# Patient Record
Sex: Female | Born: 1955 | Race: White | Marital: Married | Smoking: Former smoker
Health system: Northeastern US, Academic
[De-identification: ages and names within clinical notes are randomized; demographics above are authoritative.]

## PROBLEM LIST (undated history)

## (undated) HISTORY — PX: HYSTERECTOMY: SHX81

## (undated) HISTORY — PX: KNEE SURGERY: SHX244

## (undated) HISTORY — PX: TUBAL LIGATION: SHX77

---

## 2006-10-19 LAB — HM COLONOSCOPY

## 2006-10-30 DIAGNOSIS — R030 Elevated blood-pressure reading, without diagnosis of hypertension: Secondary | ICD-10-CM | POA: Insufficient documentation

## 2008-01-15 DIAGNOSIS — J45909 Unspecified asthma, uncomplicated: Secondary | ICD-10-CM | POA: Insufficient documentation

## 2009-11-03 ENCOUNTER — Ambulatory Visit: Payer: Self-pay | Admitting: Primary Care

## 2009-11-03 ENCOUNTER — Encounter: Payer: Self-pay | Admitting: Gastroenterology

## 2009-11-03 LAB — URINALYSIS WITH REFLEX TO MICROSCOPIC
Blood,UA: NEGATIVE
Ketones, UA: NEGATIVE
Leuk Esterase,UA: NEGATIVE
Nitrite,UA: NEGATIVE
Protein,UA: 10 mg/dL — AB
Specific Gravity,UA: 1.017 (ref 1.002–1.030)
pH,UA: 6 (ref 5.0–8.0)

## 2009-11-03 LAB — CBC AND DIFFERENTIAL
Baso # K/uL: 0 THOU/uL (ref 0.0–0.1)
Basophil %: 0.4 % (ref 0.1–1.2)
Eos # K/uL: 0 THOU/uL (ref 0.0–0.4)
Eosinophil %: 0 % — ABNORMAL LOW (ref 0.7–5.8)
Hematocrit: 41 % (ref 34–45)
Hemoglobin: 13.6 g/dL (ref 11.2–15.7)
Lymph # K/uL: 0.8 THOU/uL — ABNORMAL LOW (ref 1.2–3.7)
Lymphocyte %: 30.2 % (ref 19.3–51.7)
MCV: 92 fL (ref 79–95)
Mono # K/uL: 0.3 THOU/uL (ref 0.2–0.9)
Monocyte %: 10.6 % (ref 4.7–12.5)
Neut # K/uL: 1.6 THOU/uL (ref 1.6–6.1)
Platelets: 159 THOU/uL — ABNORMAL LOW (ref 160–370)
RBC: 4.5 MIL/uL (ref 3.9–5.2)
RDW: 12.4 % (ref 11.7–14.4)
Seg Neut %: 58.8 % (ref 34.0–71.1)
WBC: 2.7 THOU/uL — ABNORMAL LOW (ref 4.0–10.0)

## 2009-11-03 LAB — COMPREHENSIVE METABOLIC PANEL
ALT: 16 U/L (ref 0–35)
AST: 24 U/L (ref 0–35)
Albumin: 4.6 g/dL (ref 3.5–5.2)
Alk Phos: 59 U/L (ref 35–105)
Anion Gap: 10 (ref 7–16)
Bilirubin,Total: 0.5 mg/dL (ref 0.0–1.2)
CO2: 29 mmol/L — ABNORMAL HIGH (ref 20–28)
Calcium: 9.2 mg/dL (ref 8.6–10.2)
Chloride: 99 mmol/L (ref 96–108)
Creatinine: 0.88 mg/dL (ref 0.51–0.95)
GFR,Black: >59 *
GFR,Caucasian: >59 *
Glucose: 105 mg/dL (ref 74–106)
Lab: 14 mg/dL (ref 6–20)
Potassium: 4 mmol/L (ref 3.3–5.1)
Sodium: 138 mmol/L (ref 133–145)
Total Protein: 7.5 g/dL (ref 6.3–7.7)

## 2009-11-03 LAB — URINE MICROSCOPIC (IQ200)
RBC,UA: <1 /HPF (ref 0–2)
WBC,UA: <1 /HPF (ref 0–5)

## 2009-11-04 LAB — AEROBIC CULTURE

## 2009-11-05 ENCOUNTER — Ambulatory Visit: Payer: Self-pay | Admitting: Primary Care

## 2009-11-05 ENCOUNTER — Other Ambulatory Visit: Payer: Self-pay | Admitting: Primary Care

## 2009-11-05 LAB — CBC
Hematocrit: 39 % (ref 34–45)
Hemoglobin: 13.1 g/dL (ref 11.2–15.7)
MCV: 91 fL (ref 79–95)
Platelets: 143 THOU/uL — ABNORMAL LOW (ref 160–370)
RBC: 4.3 MIL/uL (ref 3.9–5.2)
RDW: 12.4 % (ref 11.7–14.4)
WBC: 2.4 THOU/uL — ABNORMAL LOW (ref 4.0–10.0)

## 2009-11-05 NOTE — Progress Notes (Signed)
Reason For Visit   Persistent fever, low white blood cell count.  HPI   Patient presents again after calling the office with recurrent fever.    Although she felt a little bit better she notes that she continues to spike   fevers.  She denies any shortness of breath or wheezing.  She continues to   complain of a dry hacking cough and pain behind her eyes towards the end of   the day.  She has no GI symptoms including nausea, vomiting or diarrhea.    She does wonder if she may have an early sinus infection.  She is eating   and drinking without difficulty.  Allergies   Latex Exam Gloves MISC; Rash  Sulfa Drugs; Hives.  Current Meds   Fluticasone Propionate 50 MCG/ACT Suspension;one squirt in each nostril   nightly; Rx  Proventil HFA 108 (90 Base) MCG/ACT Aerosol Solution;INHALE 1-2 PUFFS EVERY   4-6 HOURS AS NEEDED AND AS DIRECTED.; Rx  Penicillin V Potassium 500 MG Tablet;TAKE 1 CAPSULE TWICE DAILY; Rx  Estrace 0.1 MG/GM Cream;; RPT  Azithromycin 250 MG Tablet;TAKE 2 TABLETS ON DAY 1 THEN TAKE 1 TABLET A DAY   FOR 4 DAYS.; Rx.  Active Problems   Asthmatic Bronchitis (493.90)  Complete Colonoscopy  Prehypertension (796.2).  Vital Signs   Recorded by [facility] on 05 Nov 2009 11:59 AM  BP:112/80,  RUE,  Sitting,   HR: 104 b/min,  R Radial, Normal,   Temp: 98.6 F,  Oral,   Weight: 158.6 lb.  Physical Exam   53 year old in no acute distress, nontoxic appearing  Vital signs as recorded including oximetry of 95% on room air  HEENT: TMs pearly gray, oropharynx mildly injected without exudate, neck   supple  Lungs are clear to auscultation without rales or wheezes  Cardiac rate and rhythm are slightly tachycardic but regular  Extremities are without edema.  Results   Rapid strep test negative  .  Assessment   Assessment and plan:  53 year old with persistent fevers and dry hacking cough with pain behind   the eyes.  Her throat culture is negative.  She may have an early sinusitis   but I favor rechecking CBC today as  well as a chest x-ray.  Pending the   results of these I may end up treating her with antibiotics.     Addendum: [facility] called with chest x-ray results with questionable early left   lower lobe pneumonia.  I have started patient on Zithromax.  She is to call   with persistent or worsening symptoms.  Signature   Electronically signed by: Rosalene Billings  M.D.; 11/05/2009 3:48 PM EST.

## 2009-11-07 LAB — STREP A CULTURE, THROAT

## 2009-11-07 NOTE — Progress Notes (Signed)
Reason For Visit   Cough, fever, and pain behind eyes.  HPI   Pt noted last week Tues/WEd that she had chills, very occasional cough.  By   Friday- felt cold and then saturday took temp- 99.5 and then up to 100.5.    Has used theraflu and went to bed- remained cold and cough has progressed.    Still dry cough.  Pain behind both eyes like an ache after a full day-   better in the am.  Drinking fluids well, no sore throat, vomiting, or   diarrhea.  SOB with stairs, no wheezing.  No chest pain. ? chest   congestion.  No ill contacts.  Allergies   Latex Exam Gloves MISC; Rash  Sulfa Drugs; Hives.  Current Meds   Fluticasone Propionate 50 MCG/ACT Suspension;one squirt in each nostril   nightly; Rx  Proventil HFA 108 (90 Base) MCG/ACT Aerosol Solution;INHALE 1-2 PUFFS EVERY   4-6 HOURS AS NEEDED AND AS DIRECTED.; Rx  Penicillin V Potassium 500 MG Tablet;TAKE 1 CAPSULE TWICE DAILY; Rx.  Active Problems   Asthmatic Bronchitis (493.90)  Complete Colonoscopy  Prehypertension (796.2).  Vital Signs   Recorded by Greig Right on 03 Nov 2009 10:44 AM  BP:116/78,  RUE,  Sitting,   HR: 116 b/min,  R Radial,   Temp: 100.4 F,  Oral,   Weight: 159.8 lb,   O2 Sat: 96.0 (%SpO2),  RA.  Physical Exam   Fatigued-appearing 53 year old nontoxic and coughing  Vital signs are as recorded  HEENT: TMs are pearly gray, oropharynx appears slightly injected and dry   without exudate, neck is supple without adenopathy  Lungs are clear to auscultation throughout without rales, wheezes, or   rhonchi  Cardiac rate and rhythm are regular on recheck with a normal S1 and S2  .  Results   EKG reveals normal sinus rhythm at a rate of 95.  Assessment   Assessment and plan:  54 year old with what I suspect is a viral syndrome.  She may actually have   even had influenza.  She did get the flu shot this year.  She is far out of   the window for antiviral therapy currently.  I will check a CBC and   comprehensive metabolic profile.  5 advised patient to use  either Tylenol   or ibuprofen for her fever and to push fluids.  She is to call if her   symptoms persist or worsen.  Signature   Electronically signed by: Rosalene Billings  M.D.; 11/07/2009 12:33 PM EST.

## 2009-11-23 ENCOUNTER — Ambulatory Visit
Admit: 2009-11-23 | Discharge: 2009-11-23 | Disposition: A | Discharge status: Home / Self Care | Payer: Self-pay | Source: Ambulatory Visit | Attending: Primary Care | Admitting: Primary Care

## 2009-11-23 LAB — CBC AND DIFFERENTIAL
Baso # K/uL: 0 THOU/uL (ref 0.0–0.1)
Basophil %: 0.6 % (ref 0.1–1.2)
Eos # K/uL: 0 THOU/uL (ref 0.0–0.4)
Eosinophil %: 1.2 % (ref 0.7–5.8)
Hematocrit: 39 % (ref 34–45)
Hemoglobin: 12.7 g/dL (ref 11.2–15.7)
Lymph # K/uL: 1.8 THOU/uL (ref 1.2–3.7)
Lymphocyte %: 51.6 % (ref 19.3–51.7)
MCV: 93 fL (ref 79–95)
Mono # K/uL: 0.3 THOU/uL (ref 0.2–0.9)
Monocyte %: 7.6 % (ref 4.7–12.5)
Neut # K/uL: 1.3 THOU/uL — ABNORMAL LOW (ref 1.6–6.1)
Platelets: 220 THOU/uL (ref 160–370)
RBC: 4.2 MIL/uL (ref 3.9–5.2)
RDW: 12.6 % (ref 11.7–14.4)
Seg Neut %: 39 % (ref 34.0–71.1)
WBC: 3.4 THOU/uL — ABNORMAL LOW (ref 4.0–10.0)

## 2010-03-15 ENCOUNTER — Ambulatory Visit
Admit: 2010-03-15 | Discharge: 2010-03-15 | Disposition: A | Discharge status: Home / Self Care | Payer: Self-pay | Source: Ambulatory Visit | Attending: Primary Care | Admitting: Primary Care

## 2010-03-15 LAB — CBC AND DIFFERENTIAL
Baso # K/uL: 0 THOU/uL (ref 0.0–0.1)
Basophil %: 0.6 % (ref 0.1–1.2)
Eos # K/uL: 0 THOU/uL (ref 0.0–0.4)
Eosinophil %: 1.3 % (ref 0.7–5.8)
Hematocrit: 38 % (ref 34–45)
Hemoglobin: 12.6 g/dL (ref 11.2–15.7)
Lymph # K/uL: 1.3 THOU/uL (ref 1.2–3.7)
Lymphocyte %: 41.9 % (ref 19.3–51.7)
MCV: 91 fL (ref 79–95)
Mono # K/uL: 0.2 THOU/uL (ref 0.2–0.9)
Monocyte %: 7.4 % (ref 4.7–12.5)
Neut # K/uL: 1.5 THOU/uL — ABNORMAL LOW (ref 1.6–6.1)
Platelets: 187 THOU/uL (ref 160–370)
RBC: 4.2 MIL/uL (ref 3.9–5.2)
RDW: 13.2 % (ref 11.7–14.4)
Seg Neut %: 48.8 % (ref 34.0–71.1)
WBC: 3.1 THOU/uL — ABNORMAL LOW (ref 4.0–10.0)

## 2010-03-22 ENCOUNTER — Encounter: Payer: Self-pay | Admitting: Primary Care

## 2010-03-26 ENCOUNTER — Ambulatory Visit: Payer: Self-pay | Admitting: Primary Care

## 2010-03-26 ENCOUNTER — Ambulatory Visit: Payer: Self-pay | Admitting: Cardiology

## 2010-03-31 NOTE — H&P (Signed)
Reason For Visit   CPE.  HPI   1.  long standing RLE edema- this occurred after her c sections and is   steadily worse over the years, not painful but she feels it is   uncomfortable and does not understand why it has occurred.  2.  mildly low wbc count with normal hct and plt counts which we agreed to   follow for now.  3.  recovered from recent viral infection in December- she was concerned   that this lasted longer than she might normally be sick and was concerned   that it related to her low wbc- however neutrophil count was good.  4.  Pt felt a twitching in her left rib/ breast for 3-5 seconds which was   followed by a PVC.  She has had this in the past and saw Dr. Hal Hope and   she had a 24 holter monitor and it resolved.  This was 28 yrs ago but   recently had one recurrence.  no SOB, no pain.  no radiation. No dizziness,   diaphoresis, SOB.  5.  WE discussed her h/o divorce in great detail today and her relationship   or lack of one with her birth mother.  She may seek further counselling at   some point.  Denies depressive sx accompanying this currently.  Allergies   Latex Exam Gloves MISC; Rash  Sulfa Drugs; Hives.  Current Meds   Estrace 0.1 MG/GM Cream;; RPT  Viactiv CHEW;CHEW ONE TABLET BY MOUTH THREE TIMES DAILY; RPT  Vitamin D 1000 UNIT Capsule;TAKE 2 CAPSULE DAILY; RPT  Flax Seed Oil 1000 MG Capsule;TAKE AS DIRECTED.; RPT.  ** Medication reconciliation completed. **  .  Active Problems   Asthmatic Bronchitis (493.90)  Complete Colonoscopy  Prehypertension (796.2).  PMH   Prehypertension (796.2).  PSH   Cesarean Hysterectomy  Complete Colonoscopy  Knee Surgery  Tubal Ligation (V25.2).  Family Hx   Family history of Adopted  Family history of Breast Cancer  Family history of Hypertension  Family history of Stroke Syndrome.  Personal Hx   Divorced, tempered relationship with her ex  Four children via c section  wears seat belt, gets routine dental and eye care  does not exercise regularly, watches  diet  does clerical work at [employer]  quit smoking fifteen years ago, approx one drink/ month.  ROS   CONSTITUTIONAL: Appetite good, no fevers, night sweats or weight loss  EYES: No visual changes, no eye pain  YTK:ZSWF hearing loss  CV: No chest pain, shortness of breath or peripheral edema- see HPi  RESPIRATORY: No cough, wheezing or dyspnea  GI: No nausea/vomiting, abdominal pain, or change in bowel habits  GU: No dysuria, urgency or incontinence  MS: No joint pain/swelling or musculoskeletal deformities- occasional stiff   in the hips  SKIN: No rashes  NEURO: No MS changes, no motor weakness, no sensory changes  PSYCH: No depression or anxiety  ENDOCRINE: No polyuria/polydipsia, no heat intolerance  HEME/LYMPH: easy bruising since she was a child or swollen nodes  ALL/IMMUN: No allergic reactions.  Immunizations   Td; 15 Jun 2005  Influenza; 24 Aug 2009.  Health Mgmt Plan   DEXA scan every 3 years; for HEALTH MAINTENANCE.  Vital Signs   Recorded by [facility] on 26 Mar 2010 10:01 AM  BP:138/78,  LUE,  Sitting,   HR: 80 b/min,  R Radial, Normal,   Height: 64 in, Weight: 160.6 lb, BMI: 27.6 kg/m2.  Physical Exam  GENERAL APPEARANCE: Normal habitus. Well developed, well groomed. Appears   stated age. No acute distress. Color good.  MENTAL STATUS: Appears alert and oriented. Affect appropriate.  SKIN: Skin color and turgor normal. No suspicious lesions, masses, rashes,   or ulcerations. Nails and hair appear normal.  HEAD: Normocephalic.  EARS: External ear w/o scars, masses, or lesions. External auditory canal   intact, clear, and w/o lesions. TMs intact with normal light reflex and   landmarks. Acuity to conversational tones good.  EYES: PERRLA, extraocular movements intact. Lids w/o defect, conjunctiva   and sclera appear normal.  MOUTH: Teeth in good repair. Gums pink w/o lesions. Normal appearing   mucosa, palate, and tongue.   OROPHARYNX: Moist w/o exudate, erythema, or swelling.  NECK: Symmetric, trachea  midline. Full ROM w/o pain or tenderness. Thyroid   nontender w/o enlargement or masses. Carotid pulses normal with no bruits.   No cervical lymphadenopathy.  BREASTS: Breasts appear normal and symmetric w/o palpable masses, skin   changes, or nipple inversion. No discharge, rash, or skin retraction by   inspection. No axillary or supraclavicular lymphadenopathy.  CHEST: Respirations unlabored with normal diaphragmatic excursion. Chest   wall symmetric with no  masses. Breath sounds clear bilaterally w/o wheezes, rubs, rales, or   rhonchi.  CV: Normal precordium and PMI w/o lifts, heaves, or thrills. Normal S1 and   S2 w/o murmur, rub, gallop, or click. Capillary refill within 2 seconds. No   edema, clubbing, or cyanosis. No varicosities. Radial, femoral, dorsalis   pedis, and posterior tibial pulses full and symmetrical.  GI/ABDOMEN: Abdomen soft with normal bowel sounds. No guarding or rebound.   No palpable masses or tenderness. Liver and spleen are w/o tenderness or   enlargement. No aortic widening. No inguinal adenopathy.  GU: per yearly ob/gyn exam  MS: Muscle tone and strength normal for age, w/o atrophy or abnormal   movement.  EXTREMITIES: Joints w/full ROM, w/o tenderness, crepitus, or contracture.   No obvious joint deformities or effusions.  NEUROLOGICAL: Cranial nerves II-XII intact. Motor strength symmetrical with   no obvious weaknesses. Superficial sensation intact bilaterally to light   touch and pain. Observed dexterity w/o ataxia or tremor. Deep tendon   reflexes full and symmetric bilaterally. Gait coordinated and smooth.  Results   CBC,PLT/DIFF - CBCD   15 Mar 2010 08:10 AM  -   WBC: 3.1 thou/uL  -   RBC: 4.2 MIL/uL  -   HEMOGLOBIN: 12.6 g/dl  -   HEMATOCRIT: 38 %  -   MCV: 91 fL  -   RDW: 13.2 %  -   PLATELET COUNT: 187 thou/uL  -   NEUTROPHIL #: 1.5 thou/uL  -   LYMPHOCYTE #: 1.3 thou/uL  -   MONOCYTE #: 0.2 thou/uL  -   EOSINOPHIL #: 0.0 thou/uL  -   BASOPHIL #: 0.0 thou/uL  -   NEUTROPHILS:  48.8 %  -   LYMPHOCYTES: 41.9 %  -   MONOCYTES: 7.4 %  -   EOSINOPHILS: 1.3 %  -   BASOPHILS: 0.6 %.  EKG shows sinus rhythm with short PR interval and ? accelerated AV   conduction, no block, no ST-T wave changes.  Assessment   A/P:  1.  Palpitations:  given short PR interval on EKG and cardil ref in the   past for unclear reasons, I would like to proceed with holter monitor and   will have her f/u with cardiol.  This seems low  likelihood to be ischemic   given her history and EKG findings.      2.  RLE edema:  probable venous insuff vs. lymphedema- will have vasc eval   and if lymphedema she may benefit from lymphedema clinic vs treatement for   vensous stasis- I don't think she needs pelvic imaging given extremely long   duration of sx.     3.  low wbc count:  likely normal variant but we will follow with q 6 mo   wbc count.  her infection completely cleared     4.  h/o significant stressors:  she may seek further counselling for these   issues.     RTO one year, prn.  Signature   Electronically signed by: Rosalene Billings  M.D.; 03/31/2010 2:53 PM EST.

## 2010-04-14 ENCOUNTER — Other Ambulatory Visit: Payer: Self-pay | Admitting: Gastroenterology

## 2010-04-14 ENCOUNTER — Encounter: Payer: Self-pay | Admitting: Gastroenterology

## 2010-04-14 ENCOUNTER — Ambulatory Visit: Payer: Self-pay | Admitting: Cardiovascular Disease

## 2010-04-15 NOTE — Consults (Signed)
Client Name: Danielle Frye, Danielle Frye  Client ID (MRN): [id number]  Care Emmalise Huard (Primary): Dr Lannie Fields  Care Dandrea Widdowson (Referral): Dr Lannie Fields  Care Pamela Maddy (Service): Rebecka Apley. Hal Hope, MD  Client Date of Birth (Age): July 07, 1956 (54 years old)  Visit Date (Time): 04/14/2010 (2:30 PM)  Visit Service Area/Type: CLIN: Office Visit (Initial)  Visit Patient Type: Outpatient  Visit Lock Code (Date/Time): 045409.81191 (04/15/2010 at 12:47:22)                                 ******BODY OF REPORT******    Dear Dr Chestine Spore:    Fawn Kirk PRESENT ILLNESS: I had the pleasure of seeing Danielle Frye who is a   54 year old white female with a history of minor palpitations.    I last saw her 28 years ago when she noted some probable extrasystoles   following her fourth cesarean section.    Over the past year she has had three episodes of a feeling of twitching   on the left side of the chest, occasionally with an extrasystole.    There was no sustained tachyarrhythmia.  There was no syncope or   presyncope.  There has been no chest discomfort related to exertion.    She denies dyspnea.  She does not exercise regularly but has no   symptoms related to exercise.    There is no history of hypertension or of diabetes.  She does not have   hyperlipidemia.  She stopped smoking more than twenty years ago.    There is a  family history of sudden death.  A half sibling died at age   70 probably related to "commotio cordis".   He apparently was healthy,   was hit by a snowball and collapsed.  No other family history is   available as she was adopted.    PAST MEDICAL HX: Her past medical history is unremarkable aside from   probable lymphedema that she noted following her last pregnancy.    PAST SURGICAL HX:  1. Knee surgery.  2. C section X 4.  3. BTL    SOCIAL HX:  She is divorced.  She has four children and three   grandchildren.    REVIEW OF SYSTEMS: Negative.    MEDICATION HX:   (1) Flax seed oil  (2) Vitamin D 2000 units per  day    ALLERGY HX: (1) Latex, (2) Sulfa.     ECG INTERPRETATION: Normal sinus rhythm with borderline short PR   interval of 125 ms.     PHYSICAL EXAM: BP: 134/80.  PULSE: 70 and regular.  HEENT: Negative.    Neck: no neck vein distention.  No thyroid palpable.   Carotids: equal bilaterally without bruits.   LUNGS:  The lungs are clear to percussion and auscultation.  CARDIAC:  PMI is at the midclavicular line.  There are normal first and   second heart sounds.  There was no murmur or gallop.  ABDOMEN:  No palpable liver, kidney or spleen.  There was no palpable   abdominal aortic aneurysm.    EXTREMITIES: Peripheral pulses were intact and there was no peripheral   edema.  A COMPLETE CARDIOVASCULAR EXAMINATION WAS PERFORMED.    IMPRESSION:  She is a healthy female without significant cardiac   symptoms other than occasional palpitations.  I had the pleasure of   reassuring her.  She was advised that when she  has a recurrent episode   to take her pulse and to alert me if there is marked irregularity or   rapid pulse rate.  She will let me know if these symptoms continue.  At   this point I don't think she requires further testing.    She did have a Holter that was completely unremarkable.    Although the short PR interval can be associated with arrhythmias,   there is no clinical evidence that she has had any arrhythmias such as   SVT or atrial fibrillation.    Thank you.          Sincerely,  ***Electronically signed by***  _________________________  Rebecka Apley. Hal Hope, MD  Clinical Associate Professor                            ******REPORT DISTRIBUTION LIST******    PRIMARY CARE Lattie Riege: Richardean Chimera Brallan Denio: Lannie Fields

## 2010-04-28 ENCOUNTER — Other Ambulatory Visit: Payer: Self-pay | Admitting: Vascular Surgery

## 2010-04-28 ENCOUNTER — Ambulatory Visit: Payer: Self-pay | Admitting: Vascular Surgery

## 2010-04-28 ENCOUNTER — Other Ambulatory Visit: Payer: Self-pay | Admitting: Gastroenterology

## 2010-04-28 ENCOUNTER — Ambulatory Visit: Payer: Self-pay

## 2010-04-30 LAB — EKG 12-LEAD
P: 21 degrees
PR: 125 ms
QRS: -5 degrees
QRSD: 88 ms
QT: 347 ms
QTc: 408 ms
Rate: 83 {beats}/min
Severity: NORMAL
Statement: NORMAL
T: 54 degrees

## 2010-05-20 ENCOUNTER — Other Ambulatory Visit: Payer: Self-pay | Admitting: Gastroenterology

## 2010-05-20 ENCOUNTER — Ambulatory Visit: Payer: Self-pay | Admitting: Internal Medicine

## 2010-05-20 LAB — COMPREHENSIVE METABOLIC PANEL
ALT: 15 U/L (ref 0–35)
AST: 28 U/L (ref 0–35)
Albumin: 4.7 g/dL (ref 3.5–5.2)
Alk Phos: 62 U/L (ref 35–105)
Anion Gap: 12 (ref 7–16)
Bilirubin,Total: 0.5 mg/dL (ref 0.0–1.2)
CO2: 27 mmol/L (ref 20–28)
Calcium: 9.5 mg/dL (ref 8.6–10.2)
Chloride: 101 mmol/L (ref 96–108)
Creatinine: 0.78 mg/dL (ref 0.51–0.95)
GFR,Black: >59 *
GFR,Caucasian: >59 *
Glucose: 98 mg/dL (ref 74–106)
Lab: 16 mg/dL (ref 6–20)
Potassium: 4.7 mmol/L (ref 3.3–5.1)
Sodium: 140 mmol/L (ref 133–145)
Total Protein: 7.3 g/dL (ref 6.3–7.7)

## 2010-05-20 LAB — CBC
Hematocrit: 41 % (ref 34–45)
Hemoglobin: 13.8 g/dL (ref 11.2–15.7)
MCV: 91 fL (ref 79–95)
Platelets: 207 THOU/uL (ref 160–370)
RBC: 4.5 MIL/uL (ref 3.9–5.2)
RDW: 12.3 % (ref 11.7–14.4)
WBC: 4.1 THOU/uL (ref 4.0–10.0)

## 2010-05-20 LAB — TSH: TSH: 1.99 u[IU]/mL (ref 0.27–4.20)

## 2010-05-21 NOTE — Progress Notes (Addendum)
Reason For Visit   [id number]      MEDICATIONS/CHART REVIEWED     SUBJECTIVE:patient states today approximately 12:15 was sitting at her desk   looking at a computer screen and talking on the telephone she had sudden   onset of feeling like she had drank a glass of wine on an empty stomach.    Her vision was slightly off, not like the room spinning, but off.  she felt   loopy and lightheaded intermittently for approximately one hour also   associated with this was a very mild discomfort in her left eye and head    which resolved.The computer monitor she was looking at was installed the   day before and this was the first day she had used it.  The lightheadedness   was more persistent and intermittent by the time she got here she stated   she felt 90% better and by the end of the visit she was 100% better.  she   denies any shortness of breath, chest pain, diaphoresis, nausea or   vomiting.  Patient is being followed in the lymphedema clinic for right leg   swelling.  Yesterday she applied an Ace wrap as she was instructed from the   clinic to her right lower leg, but had to remove it while driving to work   because it made her leg ache.  Last night she took a Motrin for the aching   and today the leg feels fine.  2 days ago she took Benadryl to help her   sleep at night.      patient's last eye exam was October 2010.     OBJECTIVE:  GENERAL:  AandOX3patient appears very comfortable  LUNGS:  CTA  HEART:  RRR  SKIN:      no rashes  JW:[id number] lower leg generalized swelling as compared to no swelling in left   leg, no redness, no increased warmth, nontender, no firmness negative   Homans sign    EKG: No acute changes, reviewed with Dr. Gerlene Burdock and plan agreed upon     ASSESSMENT:Palpitations, dizziness      PLAN:  1.  CBC, CMP, TSH  2.  Have instructed patient if symptoms return she needs to go to the   emergency department for full evaluation  3.  Followup with Dr. Chestine Spore next week Amended: Ernst Breach ; 05/21/2010      9:48 AM EST.  Active Problems   Asthmatic Bronchitis (493.90)  Complete Colonoscopy  Prehypertension (796.2).  Current Meds   Viactiv CHEW;CHEW ONE TABLET BY MOUTH THREE TIMES DAILY; RPT  Vitamin D 1000 UNIT Capsule;TAKE 2 CAPSULE DAILY; RPT  Flax Seed Oil 1000 MG Capsule;TAKE AS DIRECTED.; RPT  Estrace 0.1 MG/GM Cream;; RPT.  Allergies   Latex Exam Gloves MISC; Rash  Sulfa Drugs; Hives.  Latex Screen   ---Patient  acknowledges an allergy to latex or rubber products.  --Patient  acknowledges a reaction such as hives, swelling, or difficulty   breathing after contact with rubber products.  --Patient  acknowledges that they are currently on latex precautions.  **If yes to any of the above, institute latex precautions.**.  Vital Signs   Recorded by yramirez on 20 May 2010 02:00 PM  Temp: 37.2 C,   Height: 64.0 in, Weight: 74.4 kg, BMI: 28.2 kg/m2,   Pain Scale: 0.  Health Mgmt Plan   Colonoscopy every 10 years; for Complete Colonoscopy  DEXA scan every 3 years; for HEALTH MAINTENANCE  Mammogram every 1 year; for HEALTH MAINTENANCE.  Signature   Electronically signed by: Ernst Breach  N.P.; 05/20/2010 4:03 PM EST.  Electronically signed by: Lannie Fields  M.D.; 05/21/2010 9:39 AM EST.  Electronically signed by: Ernst Breach  N.P.; 05/21/2010 9:48 AM EST.

## 2010-05-26 LAB — EKG 12-LEAD
P: 38 degrees
PR: 158 ms
QRS: -20 degrees
QRSD: 92 ms
QT: 352 ms
QTc: 404 ms
Rate: 79 {beats}/min
Severity: NORMAL
Statement: NORMAL
T: 42 degrees

## 2010-05-27 ENCOUNTER — Ambulatory Visit: Payer: Self-pay | Admitting: Primary Care

## 2010-05-28 NOTE — Progress Notes (Signed)
Reason For Visit   F/u dizziness and pounding episode.  HPI   Pt noted that she saw the lymphedema specialist and had RLE wrapped and   this caused the RLE to be uncomfortable, pt could not sleep and took a low   dose of children's benadryl.  Pt slept well.  Leg was better.  Wed am leg   felt uncomfortable being wrapped and pt took off the wrapping.  Thurday am   leg was actually much improved but pt had wrapped it to the thigh all night   Wed.  Pt noted pooling of blood and swelling of the leg.  Pt felt drugged   or drunk looking at the computer screen.  Then developed a pounding in the   chest.  Health services took her BP and it was quite high.  She came in and   saw Zettie Pho NP here at the office.  She took same aspirin on the way.     Felt better here but a bit of waves of  movement - vertigo/ dizziness.  She   is ultimately better but now has had no further sx since she stopped   compressive dressing.     Pt remains concerned about her "heart" and wonders why she did not have a   stress test.  We did discuss palpitations and she does not have any   exertional symptoms.  Allergies   Latex Exam Gloves MISC; Rash  Sulfa Drugs; Hives.  Current Meds   Viactiv CHEW;CHEW ONE TABLET BY MOUTH THREE TIMES DAILY; RPT  Vitamin D 1000 UNIT Capsule;TAKE 2 CAPSULE DAILY; RPT  Flax Seed Oil 1000 MG Capsule;TAKE AS DIRECTED.; RPT  Estrace 0.1 MG/GM Cream;; RPT.  Active Problems   Asthmatic Bronchitis (493.90)  Complete Colonoscopy  Prehypertension (796.2).  Vital Signs   Recorded by CUEVAS,SABRINA on 27 May 2010 11:23 AM  Temp: 36.5 C,   Height: 63.625 in, Weight: 73.6 kg, BMI: 28.2 kg/m2,   Pain Scale: 0.  Recorded by [email or web address] on 27 May 2010 12:32 PM  BP:128/82,   HR: 76 b/min.  Physical Exam   Well appearing 54 yo in NAD  VS as recorded  lungs: CTA bilat  Card: RRR iwth nl S1 and S2, no murmur, rub, gallop  Ext:  nonpitting edema stable.  Results   CBC/PLT - CBC   20 May 2010 03:50 PM  -   WBC: 4.1 thou/uL  -   RBC: 4.5  MIL/uL  -   HEMOGLOBIN: 13.8 g/dl  -   HEMATOCRIT: 41 %  -   MCV: 91 fL  -   RDW: 12.3 %  -   PLATELET COUNT: 207 thou/uL  COMPREHENSIVE METABOLIC PROF - CMP   20 May 2010 03:50 PM  -   SODIUM: 140 mmol/L  -   POTASSIUM: 4.7 mmol/L  -   CHLORIDE: 101 mmol/L  -   CO2: 27 mmol/L  -   ANION GAP: 12   -   UREA NITROGEN: 16 mg/dL  -   CREATININE: 1.61 mg/dL  -   GFR,CAUCASIAN: > 59  -   GFR,BLACK: > 59  -   GLUCOSE: 98 mg/dL  -   CALCIUM: 9.5 mg/dL  -   TOTAL PROTEIN: 7.3 g/dl  -   ALBUMIN: 4.7 g/dl  -   ALKALINE PHOSPHATASE: 62 u/l  -   T BILI: 0.5 mg/dL  -   AST: 28 u/l  -   ALT: 15 u/l  TSH - TSH   20 May 2010 03:50 PM  -   TSH: 1.99 uIU/ml.  Assessment   A/P:  54 yo with recent h/o short PR interval on EKG and negative workup despite   palpitations.  She was sent for lymphedema management and had elevated BP,   dizziness and worsening palpitations with negative workup including labs   and EKG.  Sx are now completely resolved since no further leg wrapping.  I   do wonder if she had significant fluid changes that could have caused her   sx and pt feels this would explain her sx given the changes with and   without compressive wrapping.  I think she is low risk for cardiac disease   but given her palpitations and almost near syncope with the compressive   dressing will check formal stress echo.     Pt to f/u as needed and I will review stress echo.  I did review labs and   EKG as well as cardiology consultation in detail with pt.  Signature   Electronically signed by: Lannie Fields  M.D.; 05/28/2010 7:08 AM EST.

## 2010-06-02 ENCOUNTER — Other Ambulatory Visit: Payer: Self-pay

## 2010-06-09 ENCOUNTER — Ambulatory Visit: Payer: Self-pay | Admitting: Vascular Surgery

## 2010-06-10 ENCOUNTER — Ambulatory Visit: Payer: Self-pay

## 2010-06-10 ENCOUNTER — Other Ambulatory Visit: Payer: Self-pay | Admitting: Gastroenterology

## 2010-08-04 ENCOUNTER — Ambulatory Visit: Payer: Self-pay | Admitting: Vascular Surgery

## 2010-09-16 ENCOUNTER — Ambulatory Visit: Payer: Self-pay | Admitting: Primary Care

## 2010-09-20 NOTE — Progress Notes (Signed)
 Reason For Visit   Sore throat.  HPI   Pt noted sore throat onset late sunday, felt a dry patch- no fevers/   chills.  Stayed home Monday and Tuesday- took theraflu.  Helped some but   developed laryngitis and then last night had felt febrile and chills the   rest of last night until this am.  Noted phlegm with cough green/ yellow in   color.    Feels okay today.  Tired overall. Not SOB.  No seasonal allergies.  Allergies   Latex Exam Gloves MISC; Rash  Sulfa Drugs; Hives.  Current Meds   Estrace 0.1 MG/GM Cream;; RPT  1-Medication Reconciliation;; RPT  Flax Seed Oil 1000 MG Capsule;TAKE AS DIRECTED.; RPT  Vitamin D 1000 UNIT Capsule;TAKE 2 CAPSULE DAILY; RPT  Viactiv CHEW;CHEW ONE TABLET BY MOUTH THREE TIMES DAILY; RPT.  Active Problems   Asthmatic Bronchitis (493.90)  Complete Colonoscopy  Prehypertension (796.2).  Vital Signs   Recorded by CUEVAS,SABRINA on 16 Sep 2010 10:17 AM  BP:116/81,   HR: 112 b/min,   Temp: 37.1 C,   Height: 63.625 in, Weight: 73.9 kg, BMI: 28.3 kg/m2,   Pain Scale: 0.  Physical Exam   54 year old in no acute distress but does seem fatigued  Vital signs are as recorded  HEENT: TMs are pearly grey, oropharynx is only very mildly injected, neck   is supple  Lungs are mildly diminished throughout without active wheezing  Cardiac rate and rhythm are regular.  Assessment   Assessment and plan:  54 year old with initial viral upper respiratory infection.  She appears to   have more chest congestion and lower respiratory symptoms presently.  I   don't hear active wheezing but given the purulent sputum and the fever I   would like to treat her with Zithromax.     she will let me know if her symptoms persist or worsen.  She will stay out   of work until early next week.  Signature   Electronically signed by: Lannie Fields  M.D.; 09/20/2010 4:43 PM EST.

## 2010-11-30 NOTE — Letter (Signed)
April 28, 2010    Lannie Fields, MD  [address]  [facility]  [place], [place]  [address]      RE:   Danielle Frye, Danielle Frye  DOB:  02-21-1956  Unit#: [id number]    Dear Danielle Frye:    Thank you for referring Danielle Frye to the division of vascular surgery.  From what I have learned, this 55 year old female has edema in the right  leg with a constant aching behind the right patella.  On occasion, this  does extend into her right groin.  Of note, this right leg edema began  after her third cesarean section.  She saw Danielle Frye, who placed her in  thigh-high compression stockings.  She has intermittently worn these in the  past.  She denies any preceding history of DVT, hypercoagulable state,  trauma, thrombophlebitis, or varicosities.  Her past surgical history  includes 3 knee operations on the right, 4 cesarean sections, tubal  ligation.  She has an allergy to sulfa.  She takes Viactiv, flax seed oil,  vitamin D3.  She does not smoke.  She socially drinks.  She is an  Environmental health practitioner.  Has no significant family history.  Her 13  review of systems are positive for palpitations, trouble with anesthesia in  the past, osteoarthritis of the knee, she is right-handed, and easy  bruising.    A venous duplex scan of the right lower extremity revealed that there is no  DVT in the deep or superficial venous system.  The competency exam of the  right lower extremity reveals that the right deep and superficial venous  systems are competent and patent.  There is a single incompetent perforator  at the C1 level measuring 3.5 mm.    On examination, she is afebrile with stable vital signs, appears her stated  age.  She is pleasant, conversant, and in no acute distress.  She has 4+  carotid pulses, no bruit, no JVD, no adenopathy.  Trachea is midline.  Breath sounds are clear.  The heart is regular.  The abdomen is soft, flat,  and benign.  The aortic pulsation is not widened.  Her brachial, radial,  femoral, popliteal, and  distal pulses are all easily palpable.  Neurologically she is grossly intact, and there is a minimal amount of  edema in either lower extremity.  There is no stigmata of lymphedema or  chronic venous insufficiency.  She has no obvious varicosities and no skin  changes representative of a lipodermatosclerosis.    Danielle Frye is a 55 year old female, who has a normal deep and superficial  venous system involving the right lower extremity.  I suspect with her 3  prior knee procedures, she may have some degree of lymphedema, which  appears to be episodic.  I suspect her current management option will be  best treated conservatively, that being with a knee-high or a thigh-high  compression garment.  If by chance the edema fails to respond favorably or  worsens, it would be at that point I would consider additional imaging  studies for her.  At this point in time, I feel once again that  conservative therapy with a compression garment is the best means to  address her episodic right lower extremity edema.    Thank you for allowing me to participate in her care.  Should you have any  questions, feel free to contact me.      Sincerely,  Electronically Signed and Finalized by  Teena Dunk, MD 05/20/2010 07:14  ____________________________________  Teena Dunk, MD      DD:   05/17/2010  DT:   05/18/2010  7:15 A  ZOX/[id number]  [id number]      cc:   Lannie Fields, MD

## 2010-11-30 NOTE — Miscellaneous (Unsigned)
 Continuity of Care Record  Created: todo  From: Lannie Fields.  From:   From: [employer], EHR v10.2.7.53  To: [name]  Purpose: Patient Use;       Problems  Diagnosis: Asthmatic Bronchitis (493.90)   Problem: Complete Colonoscopy  Diagnosis: Prehypertension (796.2)     Family History  Family history of Adopted  Family history of Breast Cancer (V16.3)   Family history of Hypertension (V17.49)   Family history of Stroke Syndrome (V17.1)     Alerts  Allergy - Sulfa Drugs Hives   Allergy - Latex Exam Gloves MISC Rash     Medications  1-Medication Reconciliation ; RPT   Amoxicillin 500 MG Capsule; TAKE 1 CAPSULE 3 TIMES DAILY. ; Rx   Estrace 0.1 MG/GM Cream ; RPT   Flax Seed Oil 1000 MG Capsule; TAKE AS DIRECTED. ; RPT   Fluticasone Propionate 50 MCG/ACT Suspension; 1-2 sprays in each nostril   nightly ; Rx   Viactiv CHEW; CHEW ONE TABLET BY MOUTH THREE TIMES DAILY ; RPT   Vitamin D 1000 UNIT Capsule; TAKE 2 CAPSULE DAILY ; RPT     Immunizations  Td   Influenza

## 2011-04-19 ENCOUNTER — Telehealth: Payer: Self-pay | Admitting: Primary Care

## 2011-04-19 NOTE — Telephone Encounter (Signed)
 Paperwork was completed and faxed. Left vm for patient to call me back, will also mail a copy of the form to patient.

## 2011-07-13 LAB — HM PAP SMEAR

## 2011-08-23 LAB — HM MAMMOGRAPHY

## 2012-03-08 ENCOUNTER — Telehealth: Payer: Self-pay | Admitting: Primary Care

## 2012-03-08 DIAGNOSIS — Z780 Asymptomatic menopausal state: Secondary | ICD-10-CM

## 2012-03-08 NOTE — Telephone Encounter (Signed)
Patient stated that she would like a Dexa Scan and was informed by [facility] (on [address]) that she needs an order request.  Patient provided [facility] fax number:  [phone].  She is also inquiring if/when a physical will be covered by her insurance so that she may only pay the co-pay.  Her last visit to your office was 09/16/2010.  She would like a physical sometime this year, but, as mentioned, at the appropriate time when the insurance company will cover it.  Please call patient at [phone] as soon as possible to advise her when the Dexa Scan order has been faxed to [facility], along with answering her question about obtaining a physical at some point this year.  (She said that you may leave a message on her recorder if needed.)

## 2012-03-09 NOTE — Telephone Encounter (Addendum)
Patient has not been seen in the office since 2011 and her visits in 2011 were office visits, not PHY; PHY appointment should be covered.     Call to patient, left vm.

## 2012-03-09 NOTE — Telephone Encounter (Signed)
Can you order DEXA scan?     I am unsure of patient's individual benefits, so I will offer her an appointment and advise her to verify with her insurance company how often she is able to have a PHY.

## 2012-03-09 NOTE — Telephone Encounter (Signed)
pls call her insurance and see when a physical would be covered.  I can order a DEXA but without an office visit or physical it would be better to order and address as part of a visit.  Let me know.  Tx,  nsc

## 2012-03-09 NOTE — Telephone Encounter (Signed)
Call to patient again, left another vm.

## 2012-03-12 ENCOUNTER — Telehealth: Payer: Self-pay | Admitting: Primary Care

## 2012-03-12 NOTE — Telephone Encounter (Signed)
Patient stated that she is returning [facility] call from Friday, 4/19, and wishes a return call at [phone] as soon as possible today, 4/22.

## 2012-03-12 NOTE — Telephone Encounter (Signed)
I did the DEXA scan but it did not print out- pls see if you can see the external referral  nsc

## 2012-03-12 NOTE — Telephone Encounter (Signed)
DEXA Scan order faxed to [facility].

## 2012-03-12 NOTE — Telephone Encounter (Signed)
Pt would like to schedule physical possibly 5/8 through 5/20 at 9 am or after 2 pm.  Pt will check with insurance regarding coverage.  Pls send dexa order to [phone].  Pt has appt w/[facility] on 4/26.  Please call the pt at [phone].  Pls lv detailed mssg regarding both rqst.

## 2012-03-12 NOTE — Telephone Encounter (Signed)
I cannot get her in during the date/time that she is requesting. Do you want to add time to see her in the afternoon after 2pm?    She is already scheduled for DEXA on 4/26.

## 2012-03-12 NOTE — Telephone Encounter (Signed)
Error

## 2012-03-12 NOTE — Telephone Encounter (Signed)
Call to patient, to discuss PHY appointment. Dr. Ophelia Charter first opening for [facility] is 6/25. Patient is scheduled for 6/27 at 9:00am. Patient questioned why our office did not call her to remind her that she is due for a PHY, explained that we had last seen her in 2011 and did not see her all of 2012. Explained that PHY appointments are generally scheduled in advance. Patient is fine with 6/27 appointment.     Patient is requesting that her order for DEXA be sent to [facility] asap. I advised her I will route to Dr. Chestine Spore and fax order as soon as we can.

## 2012-03-12 NOTE — Telephone Encounter (Signed)
pls call and find out why those dates- I am booking out further for CPE's which are generally scheduled weeks in advance-   Can we see if there is a mutually appropriate time?  nsc

## 2012-03-16 ENCOUNTER — Encounter: Payer: Self-pay | Admitting: Gastroenterology

## 2012-03-16 LAB — HM DEXA SCAN

## 2012-04-03 ENCOUNTER — Encounter: Payer: Self-pay | Admitting: Primary Care

## 2012-05-07 ENCOUNTER — Encounter: Payer: Self-pay | Admitting: Primary Care

## 2012-05-17 ENCOUNTER — Encounter: Payer: Self-pay | Admitting: Primary Care

## 2012-05-17 ENCOUNTER — Ambulatory Visit: Payer: Self-pay | Admitting: Primary Care

## 2012-05-17 VITALS — BP 140/86 | HR 92 | Temp 97.5°F | Ht 63.5 in | Wt 143.0 lb

## 2012-05-17 DIAGNOSIS — M8580 Other specified disorders of bone density and structure, unspecified site: Secondary | ICD-10-CM

## 2012-05-17 DIAGNOSIS — Z0000 Encounter for general adult medical examination without abnormal findings: Secondary | ICD-10-CM

## 2012-05-17 DIAGNOSIS — D1801 Hemangioma of skin and subcutaneous tissue: Secondary | ICD-10-CM

## 2012-05-17 LAB — HM HIV SCREENING OFFERED

## 2012-05-17 NOTE — Progress Notes (Signed)
Reason for Visit:   Chief Complaint   Patient presents with   . Follow-up       HPI: patient returns today for what was supposed to be her physical.  Unfortunately she got caught in traffic and is over 30 minutes late.  I agree to squeeze her in for an office visit as I wanted to discuss her DEXA scan and her osteopenia with her which was what prompted her to make a physical appointment.  We will reschedule her physical for next available and address her osteopenia today.  Unfortunately patient told me that she recently had a very difficult experience with her eye surgery.  She was quite upset with her anesthesiologist and related this experience to me.  I have asked her to discuss this with her ophthalmologist and surgeon in detail and encouraged her to do so.  She remains with some bruising which is persistent but improving per her report.  We reviewed her DEXA scan which did reveal osteopenia.  We've reviewed potential therapies including bisphosphonate therapy.    Medications:  Prior to Admission medications    Medication Sig Start Date End Date Taking? Authorizing Provider   ESTRACE VAGINAL 0.1 MG/GM vaginal cream  08/30/09  Yes Provider, Conversion   Calcium-Vitamin D-Vitamin K (VIACTIV) 500-500-40 MG-UNT-MCG CHEW CHEW ONE TABLET BY MOUTH THREE TIMES DAILY 03/26/10   Provider, Conversion   Cholecalciferol (VITAMIN D) 1000 UNITS capsule TAKE 2 CAPSULE DAILY 03/26/10   Provider, Conversion   Flaxseed, Linseed, (FLAX SEED OIL) 1000 MG CAPS TAKE AS DIRECTED. 03/26/10   Provider, Conversion       Medication reconciliation performed at time of visit: yes    Allergies:  Allergies   Allergen Reactions   . Latex      Created by Conversion - Rash;    . Sulfa Drugs      Created by Conversion - Hives;        Physical Exam:    Vitals:  Filed Vitals:    05/17/12 0923   BP: 140/86   Pulse: 92   Temp: 36.4 C (97.5 F)   TempSrc: Temporal   Height: 1.613 m (5' 3.5")   Weight: 64.864 kg (143 lb)       Well-appearing 56 year old in  no acute distress  The rest of the exam was deferred as this visit was to address her DEXA scan and decide about therapy    Results:  Lab Results   Component Value Date    WBC 4.1 05/20/2010    HGB 13.8 05/20/2010    HCT 41 05/20/2010    PLT 207 05/20/2010    ALT 15 05/20/2010    AST 28 05/20/2010    NA 140 05/20/2010    K 4.7 05/20/2010    CL 101 05/20/2010    CREAT 0.78 05/20/2010    UN 16 05/20/2010    CO2 27 05/20/2010    TSH 1.99 05/20/2010       Problem List  Patient Active Problem List   Diagnosis Code   . Prehypertension 796.2   . Asthmatic Bronchitis 493.90       Assessment and Plan:  56 y.o. here to discuss her DEXA scan and osteopenia.  We spoke extensively about bone loss and potential treatments.  She is very hesitant to try a bisphosphonate despite understanding the risks and benefits.  We have agreed for now that she will increase weightbearing exercise and increase vitamin D.    Her bruising is quite concerning  to her as are multiple small hemangiomas on her chest.  I have referred her to dermatology at her request.  I will plan to see her back for full physical.      Lannie Fields, MD 05/17/2012 9:44 AM

## 2012-05-18 ENCOUNTER — Encounter: Payer: Self-pay | Admitting: Primary Care

## 2012-05-18 ENCOUNTER — Ambulatory Visit
Admit: 2012-05-18 | Discharge: 2012-05-18 | Disposition: A | Discharge status: Home / Self Care | Payer: Self-pay | Source: Ambulatory Visit | Attending: Primary Care | Admitting: Primary Care

## 2012-05-18 DIAGNOSIS — Z0000 Encounter for general adult medical examination without abnormal findings: Secondary | ICD-10-CM

## 2012-05-18 DIAGNOSIS — M8580 Other specified disorders of bone density and structure, unspecified site: Secondary | ICD-10-CM

## 2012-05-18 LAB — COMPREHENSIVE METABOLIC PANEL
ALT: 15 U/L (ref 0–35)
AST: 21 U/L (ref 0–35)
Albumin: 4.5 g/dL (ref 3.5–5.2)
Alk Phos: 53 U/L (ref 35–105)
Anion Gap: 10 (ref 7–16)
Bilirubin,Total: 0.7 mg/dL (ref 0.0–1.2)
CO2: 28 mmol/L (ref 20–28)
Calcium: 9.2 mg/dL (ref 8.6–10.2)
Chloride: 100 mmol/L (ref 96–108)
Creatinine: 0.82 mg/dL (ref 0.51–0.95)
GFR,Black: 91 *
GFR,Caucasian: 79 *
Glucose: 92 mg/dL (ref 60–99)
Lab: 14 mg/dL (ref 6–20)
Potassium: 4.3 mmol/L (ref 3.3–5.1)
Sodium: 138 mmol/L (ref 133–145)
Total Protein: 6.9 g/dL (ref 6.3–7.7)

## 2012-05-18 LAB — CBC
Hematocrit: 40 % (ref 34–45)
Hemoglobin: 13.3 g/dL (ref 11.2–15.7)
MCV: 94 fL (ref 79–95)
Platelets: 214 10*3/uL (ref 160–370)
RBC: 4.3 MIL/uL (ref 3.9–5.2)
RDW: 13 % (ref 11.7–14.4)
WBC: 3.4 10*3/uL — ABNORMAL LOW (ref 4.0–10.0)

## 2012-05-18 LAB — LIPID PANEL
Chol/HDL Ratio: 2.2
Cholesterol: 203 mg/dL — AB
HDL: 91 mg/dL
LDL Calculated: 104 mg/dL
Non HDL Cholesterol: 112 mg/dL
Triglycerides: 41 mg/dL

## 2012-05-18 LAB — MAGNESIUM: Magnesium: 1.9 mEq/L (ref 1.3–2.1)

## 2012-05-18 LAB — VITAMIN B12: Vitamin B12: 408 pg/mL (ref 211–946)

## 2012-05-19 LAB — HIV 1&2 ANTIGEN/ANTIBODY: HIV 1&2 ANTIGEN/ANTIBODY: NONREACTIVE

## 2012-05-21 LAB — VITAMIN D
25-OH VIT D2: <4 ng/mL
25-OH VIT D3: 44 ng/mL
25-OH Vit Total: 44 ng/mL (ref 30–80)

## 2012-05-22 ENCOUNTER — Encounter: Payer: Self-pay | Admitting: Primary Care

## 2012-05-25 ENCOUNTER — Telehealth: Payer: Self-pay | Admitting: Internal Medicine

## 2012-05-25 NOTE — Telephone Encounter (Signed)
Message copied by Rosamaria Lints on Fri May 25, 2012 11:12 AM  ------       Message from: [name]       Created: Wed May 23, 2012  4:41 PM         Please let pt know Vit D is in normal range  ------

## 2012-05-25 NOTE — Telephone Encounter (Signed)
Spoke to patient and advised her of her normal Vitamin D level.  Patient asked if she should continue on her current dose of Vitamin D and I did advise her to do so.   Will forward to Ernst Breach, NP to confirm this.

## 2012-05-25 NOTE — Telephone Encounter (Signed)
Yes continue if stops will not be theraputic

## 2012-07-12 ENCOUNTER — Encounter: Payer: Self-pay | Admitting: Primary Care

## 2012-07-12 ENCOUNTER — Ambulatory Visit: Payer: Self-pay | Admitting: Primary Care

## 2012-07-12 VITALS — BP 106/80 | HR 100 | Temp 98.2°F | Ht 63.5 in | Wt 141.4 lb

## 2012-07-12 DIAGNOSIS — Z0000 Encounter for general adult medical examination without abnormal findings: Secondary | ICD-10-CM

## 2012-07-12 NOTE — H&P (Signed)
History and Physical    HISTORY:  Chief Complaint   Patient presents with   . Follow-up     CPE         History of Present Illness:    HPI  Pt still very concerned about the skin and discoloration around her left eye and to some extent her right eye.  She would like to talk specifically with Dr. Renato Gails.  She did not feel her interaction with her anesthesiologist was appropriate and she is quite frustrated with her skin discoloration.    Her overall health is quite good.  Work is stable and she is doing well overall.    Problems:  Patient Active Problem List   Diagnosis Code   . Prehypertension 796.2   . Asthmatic Bronchitis 493.90        Past Medical/Surgical History:   No past medical history on file.  Past Surgical History   Procedure Laterality Date   . Knee surgery       Knee Surgery Conversion Data    . Hysterectomy       Cesarean Hysterectomy Conversion Data    . Tubal ligation       Tubal Ligation Conversion Data          Allergies:    Allergies   Allergen Reactions   . Latex      Created by Conversion - Rash;    . Sulfa Drugs      Created by Conversion - Hives;        Current medications:    Current Outpatient Prescriptions   Medication Sig   . Calcium-Vitamin D-Vitamin K (VIACTIV) 500-500-40 MG-UNT-MCG CHEW CHEW ONE TABLET BY MOUTH THREE TIMES DAILY   . Cholecalciferol (VITAMIN D) 1000 UNITS capsule TAKE 2 CAPSULE DAILY   . ESTRACE VAGINAL 0.1 MG/GM vaginal cream        Family History:    Family History   Problem Relation Age of Onset   . Conversion Other      20110506^Breast Cancer^V16.3^Active^   . Conversion Other      16109604^VWUJWJ Syndrome^436^Active^   . Conversion Other      20110506^Hypertension^401.9^Active^   . Conversion Other      20110506^Adopted^^Active^       Social/Occupational History:   History     Social History   . Marital Status: Single     Spouse Name: N/A     Number of Children: Four grown children   . Years of Education: HS     Social History Main Topics   . Smoking status: Former  Smoker     Types: Cigarettes     Start date: 05/17/1992   . Smokeless tobacco: None   . Alcohol Use: rare   . Drug Use: No   . Sexually Active: Has a partner     Other Topics Concern   . None     Social History Narrative   . Wears seat belt, gets eye and  dental care         Review of Systems:    ROS  Review of Systems - General ROS: negative  Ophthalmic ROS: bruising post eye surgery  ENT ROS: ? Mild hearing loss  Allergy and Immunology ROS: negative  Hematological and Lymphatic ROS: bruising around the eye after surgery  Respiratory ROS: no cough, shortness of breath, or wheezing  Cardiovascular ROS: no chest pain or dyspnea on exertion  Gastrointestinal ROS: no abdominal pain, change in bowel habits, or black  or bloody stools  Genito-Urinary ROS: negative  Musculoskeletal ROS: negative  Neurological ROS: nerve damage to RLE with lymphedema    Vital Signs:   BP 106/80  Pulse 100  Temp(Src) 36.8 C (98.2 F) (Temporal)  Ht 1.613 m (5' 3.5")  Wt 64.139 kg (141 lb 6.4 oz)  BMI 24.65 kg/m2      PHYSICAL EXAM:  Physical Exam  BP 106/80  Pulse 100  Temp(Src) 36.8 C (98.2 F) (Temporal)  Ht 1.613 m (5' 3.5")  Wt 64.139 kg (141 lb 6.4 oz)  BMI 24.65 kg/m2    General Appearance:    Alert, cooperative, no distress, appears stated age   Head:    Normocephalic, without obvious abnormality, atraumatic   Eyes:    PERRL, conjunctiva/corneas clear, EOM's intact, fundi     benign, both eyes   Ears:    Normal TM's and external ear canals, both ears   Nose:   Nares normal, septum midline, mucosa normal, no drainage     or sinus tenderness   Throat:   Lips, mucosa, and tongue normal; teeth and gums normal   Neck:   Supple, symmetrical, trachea midline, no adenopathy;     thyroid:  no enlargement/tenderness/nodules; no carotid    bruit or JVD   Back:     Symmetric, no curvature, ROM normal, no CVA tenderness   Lungs:     Clear to auscultation bilaterally, respirations unlabored   Chest Wall:    No tenderness or deformity     Heart:    Regular rate and rhythm, S1 and S2 normal, no murmur, rub    or gallop       Abdomen:     Soft, non-tender, bowel sounds active all four quadrants,     no masses, no organomegaly           Extremities:   Extremities normal, atraumatic, no cyanosis or edema   Pulses:   2+ and symmetric all extremities   Skin:   Discoloration around the left periorbital region with dark staining, not improved from earlier exam, mild discoloration on the left   Lymph nodes:   Cervical, supraclavicular, and axillary nodes normal   Neurologic:   CNII-XII intact, normal strength, sensation and reflexes     throughout           Assessment:    56 yo female here for CPE.  Blood work done earlier this summer and results reviewed in detail.  Office visit to discuss DEXA scan done.  HCM is up to date.  Main issue for pt currently is her discoloration around her eye since cataract surgery.  This is quite upsetting to her and she did discuss with her optho but would like me to  Make contact as well.  Plan:    1.  I will call Dr. Renato Gails and discuss plastics vs derm referral to aid patient  2.  Pt to f/u with me prn and we will talk by phone once I make contact with Dr. Renato Gails.    RTO one year and prn.  Signed by Silas Sacramento. Chestine Spore, M.D. 08/04/2012 at 3:15 PM

## 2012-07-17 ENCOUNTER — Telehealth: Payer: Self-pay | Admitting: Primary Care

## 2012-07-17 NOTE — Telephone Encounter (Signed)
Yes, I did speak with Dr. Renato Gails personally and discussed her recent anesthesia experience and her facial issues.  His office is to contact her with a referral to plastic surgeon and he is to f/u with [facility] regarding her experience with the anesthesiologist.  nsc

## 2012-07-17 NOTE — Telephone Encounter (Signed)
I will route to Dr. Clark.

## 2012-07-17 NOTE — Telephone Encounter (Signed)
Patient would like to know if we made contact with Dr. Ernest Mallick office because they called her yesterday to answer some questions, but they were supposed to contact Dr. Chestine Spore for the questions. Patient wants to know if this contact was made, and, if so, what are they going to do to resolve the issue. You can reach her at work at [phone], but due to her office being busy, she might not get through, and so leaving a detailed message at her home phone, [phone], would be the best way to let her know what became of this.   Danielle Frye also wanted me to let the doctor know that she appreciates all her help so much. She was really in need of an advocate, and is very pleased with everything Dr. Chestine Spore has done for her.

## 2012-07-24 ENCOUNTER — Encounter: Payer: Self-pay | Admitting: Primary Care

## 2012-07-27 ENCOUNTER — Telehealth: Payer: Self-pay | Admitting: Primary Care

## 2012-07-27 NOTE — Telephone Encounter (Signed)
The pt called stating she was in for a physical on 8/22.  At that time, she discussed a situation with the doctor regarding her eyes.  The pt is requesting more guidance.  The pt has new information and is becoming overwhelmed.  She is requesting that she be called back at in the evening after 6:30pm, if possible at [phone], as soon as possible.  A msg may be left for the pt.  to let her know when the doctor will be calling.

## 2012-07-27 NOTE — Telephone Encounter (Signed)
Called pt- left message- will call her Monday and askedt hat she give me a number where she can be reached and I can actually talk with her about this.  I am unsure what new information she is referring to.  I did speak with Dr. Rred her eye doctor on her behalf.  nsc

## 2012-07-27 NOTE — Telephone Encounter (Signed)
Attempted call to patient to get additional information about the reason for her call.  I left a VM message requesting that she return my call.

## 2012-07-30 NOTE — Telephone Encounter (Signed)
CAlled both numbers again and could not reach pt.  I asked for a call back  Signed by Silas Sacramento. Chestine Spore, M.D. 07/30/2012 at 5:13 PM

## 2012-07-30 NOTE — Telephone Encounter (Signed)
Called work and home Monday am  Signed by Silas [place]. Chestine Spore, M.D. 07/30/2012 at 8:22 AM

## 2012-07-31 ENCOUNTER — Telehealth: Payer: Self-pay | Admitting: Primary Care

## 2012-07-31 NOTE — Telephone Encounter (Signed)
Patient is calling back/returning doctor's call  to provide a number for her to be reached @ [phone] (can call between 12 & 1 p.m.).  Stated she can also stay @ work until 5 p.m. if needed. Asking to be called back @ number provided.

## 2012-07-31 NOTE — Telephone Encounter (Signed)
Spoke to pt- she is now with Dr. Debbrah Alar at [facility] and would also like a second opinion which is very reasonable.  She did hear from [facility] and will decide if she wishes to f/u with them.  I remain available to her for supportive care but have been clear that I do not have experience with this type of skin issue so best handled by either plastic surgery or derm and she has discussed with Dr. Renato Gails her opthomologist.  Signed by Silas Sacramento. Chestine Spore, M.D. 07/31/2012 at 12:44 PM

## 2012-07-31 NOTE — Telephone Encounter (Signed)
Below documentation added to ongoing encounter.

## 2012-07-31 NOTE — Telephone Encounter (Signed)
Below documentation added to ongoing encounter.     Sheral Flow, Carlette at 07/31/2012 8:51 AM    Status: Signed               Patient is calling back/returning doctor's call to provide a number for her to be reached @ [phone] (can call between 12 & 1 p.m.). Stated she can also stay @ work until 5 p.m. if needed. Asking to be called back @ number provided.

## 2012-08-28 ENCOUNTER — Encounter: Payer: Self-pay | Admitting: Gastroenterology

## 2012-08-28 LAB — HM MAMMOGRAPHY

## 2012-08-30 ENCOUNTER — Encounter: Payer: Self-pay | Admitting: Primary Care

## 2012-09-24 ENCOUNTER — Ambulatory Visit
Admit: 2012-09-24 | Discharge: 2012-09-24 | Disposition: A | Discharge status: Home / Self Care | Payer: Self-pay | Source: Ambulatory Visit | Attending: Obstetrics | Admitting: Obstetrics

## 2012-09-28 LAB — GYN CYTOLOGY

## 2013-05-03 ENCOUNTER — Telehealth: Payer: Self-pay | Admitting: Primary Care

## 2013-05-03 NOTE — Telephone Encounter (Signed)
Left VM for patient to call back office.

## 2013-05-03 NOTE — Telephone Encounter (Signed)
Discomfort is right below sternum, not radiating anywhere, feels like she hasnt eaten in a week.   Patient denies pain, just very uncomfortable.   Patient denies vomiting, had slight nausea for one week after her trip.  Exhausted. Pt has diarrhea.   Patient felt great before trip and has not felt the same since returning.  Patient did eat uncooked meat during trip.   Patient is concerned she may be getting an ulcer or something worse.  Patient given appt with Dr Chestine Spore on Monday 05/06/13 at 10:40AM

## 2013-05-03 NOTE — Telephone Encounter (Signed)
The pt returned [facility] call.   Please call her back at [phone]

## 2013-05-03 NOTE — Telephone Encounter (Signed)
Patient came back from vacation 5 weeks ago from the [place], and has been experiencing an uncomfortable, "gnawing" feeling in her upper stomach, underneath rib cage. Patient feels as if she has not eaten anything, even though she has been eating regularly. Patient states pain does not radiate, and the area feels firm.    Patient unsure if symptoms are due to eating undercooked chicken while on vacation, or due to medications irritating her stomach.    Patient would like to see Dr.Clark urgently.

## 2013-05-06 ENCOUNTER — Ambulatory Visit: Payer: Self-pay | Admitting: Primary Care

## 2013-05-06 ENCOUNTER — Encounter: Payer: Self-pay | Admitting: Primary Care

## 2013-05-06 VITALS — BP 124/82 | HR 84 | Temp 98.6°F | Ht 63.5 in | Wt 137.7 lb

## 2013-05-06 DIAGNOSIS — K2970 Gastritis, unspecified, without bleeding: Secondary | ICD-10-CM

## 2013-05-06 MED ORDER — OMEPRAZOLE 20 MG PO CPDR *I*
DELAYED_RELEASE_CAPSULE | ORAL | Status: DC
Start: 2013-05-06 — End: 2013-05-29

## 2013-05-06 NOTE — Patient Instructions (Signed)
No alcohol, aspirin or anti-inflammatory type meds ( ibuprofen/ alleve)  Please call in 10-14 days with an update.

## 2013-05-06 NOTE — Progress Notes (Signed)
Internal Medicine Progress Note       Chief Complaint   Patient presents with   . Other     abdominal discomfort       HPI:   Pt had sinus headache in April, took mucinex and [place]- only one a day for off/on for approx two weeks.  AFter that had acid reflux that woke her from sleep twice.  On the trip late April and ate uncooked chicken and had nausea, diarrhea, stomach off- ultimately had some joint pain.  ? Not clear if she had flu or reaction to the chicken. Noticed then upper abdominal discomfort in May- almost like cramp at times, belching, gurgling, soft stool.  Notes it awakened her at night.    Some weight loss but appetite good until recently when it has fallen off.  EAting fairly bland diet right now.    Dr. Shelby Mattocks has started her on estrogen/ progesterone taking since last 11/13.           Allergies:   Allergies as of 05/06/2013 - Up to Date 05/06/2013   Allergen Reaction Noted   . Latex  10/30/2006   . Sulfa drugs  10/30/2006        Home Meds:   Prior to Admission medications    Medication Sig Start Date End Date Taking? Authorizing Provider   Calcium-Vitamin D-Vitamin K (VIACTIV) 500-500-40 MG-UNT-MCG CHEW CHEW ONE TABLET BY MOUTH THREE TIMES DAILY 03/26/10   Provider, Conversion   Cholecalciferol (VITAMIN D) 1000 UNITS capsule TAKE 2 CAPSULE DAILY 03/26/10   Provider, Conversion   ESTRACE VAGINAL 0.1 MG/GM vaginal cream  08/30/09   Provider, Conversion        Physical Exam:   Last set of vitals:  Filed Vitals:    05/06/13 1051   BP: 124/82   Pulse: 84   Temp: 37 C (98.6 F)   Height: 1.613 m (5' 3.5")   Weight: 62.46 kg (137 lb 11.2 oz)       Well-appearing 57 year old in no acute distress  Vital signs as recorded  Lungs are clear to auscultation cardiac rate and rhythm are regular  Abdomen is soft, nondistended with normoactive bowel sounds, no organomegaly  Patient has mild midepigastric tenderness without rebound or guarding        Assessment/Plan: Danielle Frye is here today for  midepigastric/upper abdominal tenderness.  Given her reflux symptoms and the setting under which this began I am suspicious that she may have gastritis.  We talked about avoiding alcohol, NSAIDs, and aspirin.  She did try ranitidine but this was not helpful and I do think she needs a PPI which I have started her on.  She currently does not have any alarm symptoms so I don't think she needs GI referral at this juncture but I will assess her in 10-14 days to make sure she is improving and see her back in 6 weeks' time.  She denies any vomiting or black or tarry stool and we agreed to hold on blood work but if she is not improving I will check a CMP and CBC.    1. Gastritis            Medications/Orders:  Orders Placed This Encounter   . omeprazole (PRILOSEC) 20 MG capsule       RTO:  6 weeks, patient was asked to contact me via my chartered telephone in 10-14 days to ensure that she is having some improvement.  Lannie Fields, MD on 05/06/2013  at 11:10 AM

## 2013-05-15 ENCOUNTER — Telehealth: Payer: Self-pay | Admitting: Primary Care

## 2013-05-15 NOTE — Telephone Encounter (Signed)
Spoke to patient.  Pt stated she is not feeling any better since new antiacid at her apt on 05/06/13.   Pt stated her stomach is waking her up in the middle of the night, terrible feeling in her stomach. Stomach is aching.  Pt drinking hot mint tea, sucking on peppermint candy.  Something is not right. Pt wants to be seen by Dr Chestine Spore asap.   Pt scheduled with Dr Chestine Spore for Monday 05/20/13 at 10:40AM   Sent to Dr Fredonia Hurley

## 2013-05-15 NOTE — Telephone Encounter (Signed)
Patient is calling to report that she is not feeling any better since she started taking the antacid on the 16th.  Please call to discuss.

## 2013-05-16 ENCOUNTER — Ambulatory Visit: Payer: Self-pay | Admitting: Internal Medicine

## 2013-05-16 ENCOUNTER — Encounter: Payer: Self-pay | Admitting: Internal Medicine

## 2013-05-16 ENCOUNTER — Telehealth: Payer: Self-pay | Admitting: Primary Care

## 2013-05-16 ENCOUNTER — Encounter: Payer: Self-pay | Admitting: Primary Care

## 2013-05-16 VITALS — BP 110/76 | HR 76 | Temp 99.0°F | Ht 63.5 in | Wt 139.0 lb

## 2013-05-16 DIAGNOSIS — R109 Unspecified abdominal pain: Secondary | ICD-10-CM

## 2013-05-16 LAB — COMPREHENSIVE METABOLIC PANEL
ALT: 14 U/L (ref 0–35)
AST: 20 U/L (ref 0–35)
Albumin: 4.2 g/dL (ref 3.5–5.2)
Alk Phos: 52 U/L (ref 35–105)
Anion Gap: 8 (ref 7–16)
Bilirubin,Total: 0.5 mg/dL (ref 0.0–1.2)
CO2: 30 mmol/L — ABNORMAL HIGH (ref 20–28)
Calcium: 9.4 mg/dL (ref 8.6–10.2)
Chloride: 102 mmol/L (ref 96–108)
Creatinine: 1.01 mg/dL — ABNORMAL HIGH (ref 0.51–0.95)
GFR,Black: 70 *
GFR,Caucasian: 61 *
Glucose: 99 mg/dL (ref 60–99)
Lab: 17 mg/dL (ref 6–20)
Potassium: 4.2 mmol/L (ref 3.3–5.1)
Sodium: 140 mmol/L (ref 133–145)
Total Protein: 6.6 g/dL (ref 6.3–7.7)

## 2013-05-16 LAB — CBC
Hematocrit: 39 % (ref 34–45)
Hemoglobin: 12.8 g/dL (ref 11.2–15.7)
MCV: 94 fL (ref 79–95)
Platelets: 189 10*3/uL (ref 160–370)
RBC: 4.2 MIL/uL (ref 3.9–5.2)
RDW: 12.8 % (ref 11.7–14.4)
WBC: 3.7 10*3/uL — ABNORMAL LOW (ref 4.0–10.0)

## 2013-05-16 LAB — AMYLASE: Amylase: 46 U/L (ref 28–100)

## 2013-05-16 LAB — LIPASE: Lipase: 30 U/L (ref 13–60)

## 2013-05-16 NOTE — Telephone Encounter (Signed)
Spoke with patient.  Advised Dr Chestine Spore requested she be seen today as she has an acute abdominal issue.   Dr Chestine Spore wants to be sure she is assessed and can make timely referrals if necessary. Pt is grateful she was accommodating to get her in.  Pt requesting GI follow up with Dr Threasa Beards as she has seen him in the past and has been very pleased with his care.  Will route to [place] to change GI referral from Dr Aquilla Hacker to Dr Threasa Beards.

## 2013-05-16 NOTE — Progress Notes (Signed)
CC: Epigastric pain    MEDICATIONS/CHART REVIEWED done at the time of visit    SUBJECTIVE: [name] is a 57 year old female.  She states that since vacation in [place] in April her stomach has been off.  She came in to see Dr. Chestine Spore earlier this month and was prescribed omeprazole 20 mg 30 minutes prior to dinner.  She has not seen any relief and believes that her symptoms could even be worse.  She has mid epigastric discomfort it does not radiate.  It wakes her during the night once or twice.  It is not affected by food.  Daily thing that makes it better is laying down.  She denies any fever, vomiting or diarrhea at this time.  At the onset she did have diarrhea but that has resolved.  She does get occasional waves of nausea without any vomiting.  She has been having increased belching.  She has been drinking peppermint tea with some relief.    OBJECTIVE:  GENERAL:  A&OX3, appears very comfortable in no acute distress  LUNGS:  CTA  HEART:  RRR,   ABD:  positive bowel sounds, soft, nontender, nondistended, no rebound, no guarding  Discussed and plan agreed upon with Dr. Chestine Spore    ASSESSMENT: Epigastric discomfort    PLAN:  1.  Discussed low fat and avoiding spicy food diet  2.  CBC, CMP, amylase, lipase pending  3.  Referral to GI for further evaluation  4.  Office visit with Dr. Chestine Spore as needed patient has followup appointment in September  Danielle Smalling, NP

## 2013-05-16 NOTE — Telephone Encounter (Signed)
Asked by Dr. Chestine Spore to obtain urgent referral to GI - [facility].  Per [facility] staff, they have no appointments available today or tomorrow.     The soonest they can get her in is Monday 05/20/2013 at 2:30 pm with Dr. Aquilla Hacker.     I have not notified her of this appointment yet.    I started referral in system to sign, they will need information faxed over.     Terry: I don't believe she needs auth, but can you make sure and obtain if needed.

## 2013-05-16 NOTE — Patient Instructions (Addendum)
Increase omeprazole to 40 mg which is 2 tablets

## 2013-05-16 NOTE — Telephone Encounter (Signed)
LM for patient to return call to office.   When she does will advise we have scheduled her tentatively for 1:20 today with NP

## 2013-05-16 NOTE — Telephone Encounter (Signed)
Faxed referral to Dr. Andrey Spearman office. Office is closed on Thursdays after 12pm. Pt should call office to schedule when they open tomorrow at 9am. # is [phone]    Please update this encounter if pt returns call.

## 2013-05-16 NOTE — Telephone Encounter (Signed)
A user error has taken place: encounter opened in error, closed for administrative reasons.

## 2013-05-16 NOTE — Telephone Encounter (Signed)
Paulla Fore will place the appropriate referral for pt and then we can proceed with that.  nsc

## 2013-05-16 NOTE — Telephone Encounter (Signed)
pls bring her in with resident this afternoon or tomorrow so I can get her seen and evaluated- fine for her to see LD  nsc

## 2013-05-16 NOTE — Telephone Encounter (Signed)
Called pt regarding her coming in to see Ernst Breach today for a visit at 1:20pm. Pt is completely unaware of why she needs to be seen today instead of Monday with Dr. Chestine Spore. I could not advise her as to why.    Advised pt that nurse would call her. Pt can be reached at work # until noon.

## 2013-05-17 NOTE — Telephone Encounter (Signed)
Call to pt to inform her that she can call Dr. Ander Gaster office to schedule soonest available appointment.    Pt stated that she upped her intake of Prilosec to 40mg  and pain decreased significantly and she was able to sleep for 11 hours. Is feeling much better.

## 2013-05-20 ENCOUNTER — Telehealth: Payer: Self-pay | Admitting: Internal Medicine

## 2013-05-20 ENCOUNTER — Ambulatory Visit: Payer: Self-pay | Admitting: Primary Care

## 2013-05-20 NOTE — Telephone Encounter (Signed)
Patient is returning Danielle Frye's call. She is asking to be called back at her work number, [phone]. Patient will be available at this number until noon today. Afterwards, she can be reached at her cell.

## 2013-05-20 NOTE — Telephone Encounter (Signed)
Spoke to pt on the phone.  Informed her per Lura NP, that her labs look good.  Pt verbalized good understanding of information.

## 2013-05-20 NOTE — Telephone Encounter (Signed)
VM message left for pt to return call to office.

## 2013-05-20 NOTE — Telephone Encounter (Signed)
Message copied by Delene Ruffini on Mon May 20, 2013 10:13 AM  ------       Message from: [name]       Created: Mon May 20, 2013  8:15 AM         Please let patient know labs look good  ------

## 2013-05-28 ENCOUNTER — Telehealth: Payer: Self-pay | Admitting: Primary Care

## 2013-05-28 NOTE — Telephone Encounter (Signed)
I believe pt was changed to omeprazole 40mg  so not sure where this request came from  Tx,  nsc

## 2013-05-28 NOTE — Telephone Encounter (Signed)
Danielle Frye requesting prescription(s) omeprazole (PRILOSEC) 40 MG capsule to be sent to [facility] [place]     Is the patient out of this medication? no.    Supply of medication left? 1  day   Does the patient have questions regarding the medication for the nurse/doctor? no

## 2013-05-28 NOTE — Telephone Encounter (Signed)
Medication request routed to MD for processing 05/28/2013 1:26 PM.    Requested Prescriptions     Pending Prescriptions Disp Refills   . omeprazole (PRILOSEC) 20 MG capsule 30 capsule 1     Sig: TAke 1/2 hr before dinner

## 2013-05-29 ENCOUNTER — Telehealth: Payer: Self-pay | Admitting: Internal Medicine

## 2013-05-29 MED ORDER — OMEPRAZOLE 20 MG PO CPDR *I*
40.0000 mg | DELAYED_RELEASE_CAPSULE | Freq: Every day | ORAL | Status: DC
Start: 2013-05-29 — End: 2013-07-15

## 2013-05-29 NOTE — Telephone Encounter (Signed)
Request came from patient, she called [name].

## 2013-05-29 NOTE — Telephone Encounter (Signed)
[facility] told patient that they did not have directions for the patient's omeprazole. It appears that the directions were indeed sent over electronically. I called the pharmacist to verify the directions.

## 2013-05-29 NOTE — Telephone Encounter (Signed)
I think Lura increased her to twice daily or 40mg - I will cc Lura and please check with pt and then I can refill  pls make sure pt made appt with GI.  Tx,  nsc

## 2013-06-07 ENCOUNTER — Encounter: Payer: Self-pay | Admitting: Gastroenterology

## 2013-06-14 ENCOUNTER — Telehealth: Payer: Self-pay | Admitting: Primary Care

## 2013-06-14 NOTE — Telephone Encounter (Signed)
Danielle Frye is scheduled to see Dr. Chestine Spore on 7/31 for gastritis follow up. Danielle Frye is scheduled to see GI for an endoscopy on 8/14, inquiring if she should reschedule her appointment with Dr. Chestine Spore to after the endoscopy.

## 2013-06-14 NOTE — Telephone Encounter (Signed)
Yes, that makes good sense as long as her symptoms are stable.  Tx,  nsc

## 2013-06-14 NOTE — Telephone Encounter (Signed)
Call to patient, left vm requesting call back to [facility] her 7/31 appointment to after 8/14 endoscopy.

## 2013-06-17 NOTE — Telephone Encounter (Signed)
Appt scheduled for 8/25 @ 11:40a with Dr Chestine Spore.

## 2013-06-17 NOTE — Telephone Encounter (Signed)
Patient called in regards to re-schedule her cancelled appointment on 7.31.14. Patient needs to schedule sometime after the 4th of August.

## 2013-06-20 ENCOUNTER — Ambulatory Visit: Payer: Self-pay | Admitting: Primary Care

## 2013-06-24 ENCOUNTER — Ambulatory Visit
Admit: 2013-06-24 | Discharge: 2013-06-24 | Disposition: A | Discharge status: Home / Self Care | Payer: Self-pay | Source: Ambulatory Visit | Attending: Gastroenterology | Admitting: Gastroenterology

## 2013-06-24 ENCOUNTER — Encounter: Payer: Self-pay | Admitting: Gastroenterology

## 2013-06-26 LAB — SURGICAL PATHOLOGY

## 2013-07-15 ENCOUNTER — Ambulatory Visit: Payer: Self-pay | Admitting: Primary Care

## 2013-07-15 ENCOUNTER — Encounter: Payer: Self-pay | Admitting: Primary Care

## 2013-07-15 VITALS — BP 124/78 | HR 82 | Ht 63.39 in | Wt 140.2 lb

## 2013-07-15 DIAGNOSIS — R109 Unspecified abdominal pain: Secondary | ICD-10-CM

## 2013-07-15 NOTE — Progress Notes (Signed)
Internal Medicine Progress Note       Chief Complaint   Patient presents with   . Follow-up     abdominal discomfort       HPI: patient returns for followup of abdominal pain.  She had an abdominal ultrasound and saw GI.endoscopy revealed mild gastritis and mild antritis.  Patient has slowly improved with proton pump inhibitor but tells me she feels she's not completely back to baseline.  She does tell me that overall she is substantially improved than when she saw me.  Her sleep is better.  She is currently in a relationship and has questions about testing for sexually transmitted diseases and we discussed these today.        Allergies:   Allergies as of 07/15/2013 - Up to Date 07/15/2013   Allergen Reaction Noted   . Latex  10/30/2006   . Sulfa drugs  10/30/2006        Home Meds:   Prior to Admission medications    Medication Sig Start Date End Date Taking? Authorizing Provider   omeprazole (PRILOSEC) 20 MG capsule Take 40 mg by mouth daily (with dinner)   TAke 1/2 hr before dinner 05/29/13  Yes Deveau, Durene Cal L, NP   Calcium-Vitamin D-Vitamin K (VIACTIV) 500-500-40 MG-UNT-MCG CHEW CHEW ONE TABLET BY MOUTH THREE TIMES DAILY 03/26/10   Provider, Conversion   Cholecalciferol (VITAMIN D) 1000 UNITS capsule TAKE 2 CAPSULE DAILY 03/26/10   Provider, Conversion        Physical Exam:   Last set of vitals:  Filed Vitals:    07/15/13 1123   BP: 124/78   Pulse: 82   Height: 1.61 m (5' 3.39")   Weight: 63.594 kg (140 lb 3.2 oz)       Well-appearing 57 year old in no acute distress  Vital signs as recorded  Lungs are clear to auscultation  Cardiac rate and rhythm are regular  Abdomen is soft, nontender and nondistended, no midepigastric tenderness, rebound or guarding        Assessment/Plan: Danielle Frye is here today for abdominal pain likely secondary to mild gastritis/antritis.  She is gradually improving on proton pump inhibitor.  She will continue this for approximately 3 months time and followup with me.  We discussed  her new potential relationship in sexual relationship and discussed how to approach sexually transmitted diseases and proper protection.  Patient was tearful when discussing her relationship and I did suggest that given her past history she may wish to check in with her therapist.    1. Abdominal pain            Medications/Orders:  Orders Placed This Encounter   No orders placed during this encounter.       RTO:  3 months    Lannie Fields, MD on 07/15/2013 at 11:28 AM

## 2013-08-05 ENCOUNTER — Ambulatory Visit
Admit: 2013-08-05 | Discharge: 2013-08-05 | Disposition: A | Discharge status: Home / Self Care | Payer: Self-pay | Source: Ambulatory Visit | Attending: Obstetrics | Admitting: Obstetrics

## 2013-08-06 ENCOUNTER — Ambulatory Visit
Admit: 2013-08-06 | Discharge: 2013-08-06 | Disposition: A | Discharge status: Home / Self Care | Payer: Self-pay | Source: Ambulatory Visit | Attending: Obstetrics | Admitting: Obstetrics

## 2013-08-06 LAB — HEPATITIS C ANTIBODY: Hep C Ab: NEGATIVE

## 2013-08-06 LAB — HEPATITIS B PROF
HBV Core Ab: NEGATIVE
HBV S Ab Quant: 0 m[IU]/mL
HBV S Ab: NEGATIVE
HBV S Ag: NEGATIVE

## 2013-08-06 LAB — SYPHILIS SCREEN
Syphilis Screen: NEGATIVE
Syphilis Status: NONREACTIVE

## 2013-08-06 LAB — HIV 1&2 ANTIGEN/ANTIBODY: HIV 1&2 ANTIGEN/ANTIBODY: NONREACTIVE

## 2013-08-06 LAB — CHLAMYDIA PLASMID DNA AMPLIFICATION: Chlamydia Plasmid DNA Amplification: 0

## 2013-08-06 LAB — N. GONORRHOEAE DNA AMPLIFICATION: N. gonorrhoeae DNA Amplification: 0

## 2013-08-06 LAB — HEPATITIS A ANTIBODY, IGM: Hep A IgM: NEGATIVE

## 2013-08-08 LAB — HERPES SIMPLEX VIRUS TYPE 1AND/OR 2 IGM BY ELISA: HSV I/II Comb IgM: 0.83 IV (ref <=0.89)

## 2013-08-09 ENCOUNTER — Ambulatory Visit
Admit: 2013-08-09 | Discharge: 2013-08-09 | Disposition: A | Discharge status: Home / Self Care | Payer: Self-pay | Source: Ambulatory Visit | Attending: Obstetrics and Gynecology | Admitting: Obstetrics and Gynecology

## 2013-08-09 LAB — HSV 1 ANTIBODY, IGG: HSV 1 IgG: 13.34 IV

## 2013-08-09 LAB — HSV 2 ANTIBODY, IGG: HSV 2 IgG: 0.55 IV

## 2013-08-10 LAB — VAGINITIS SCREEN: DNA PROBE: Vaginitis Screen:DNA Probe: 0

## 2013-08-11 LAB — AEROBIC CULTURE

## 2013-09-03 ENCOUNTER — Encounter: Payer: Self-pay | Admitting: Gastroenterology

## 2013-09-03 LAB — HM MAMMOGRAPHY

## 2013-09-05 ENCOUNTER — Encounter: Payer: Self-pay | Admitting: Primary Care

## 2013-09-23 ENCOUNTER — Ambulatory Visit: Payer: Self-pay | Admitting: Primary Care

## 2013-12-20 ENCOUNTER — Ambulatory Visit
Admit: 2013-12-20 | Discharge: 2013-12-20 | Disposition: A | Discharge status: Home / Self Care | Payer: Self-pay | Source: Ambulatory Visit | Attending: Obstetrics | Admitting: Obstetrics

## 2013-12-26 LAB — GYN CYTOLOGY

## 2013-12-26 LAB — HPV DNA PROBE WITH CYTOLOGY
HPV Other High Risk: NEGATIVE
HPV Type 16: NEGATIVE
HPV Type 18: NEGATIVE

## 2014-01-20 ENCOUNTER — Telehealth: Payer: Self-pay | Admitting: Primary Care

## 2014-01-20 ENCOUNTER — Other Ambulatory Visit: Payer: Self-pay | Admitting: Internal Medicine

## 2014-01-20 DIAGNOSIS — N390 Urinary tract infection, site not specified: Secondary | ICD-10-CM

## 2014-01-20 NOTE — Telephone Encounter (Signed)
[name] calling to report that she is experiencing a possible UTI. These symptoms have been going on for 1 day(s)    Patient requesting same day appointment? no  Patient requesting call back? yes    Phone number confirmed at [phone].

## 2014-01-20 NOTE — Telephone Encounter (Signed)
Will f/u on culture  nsc

## 2014-01-20 NOTE — Telephone Encounter (Signed)
Spoke to pt. Pt complaining of symptoms of possible UTI. Pt complaining of urinary frequency, urine cloudiness, and feeling uncomfortable. Pt states she is drinking plenty of fluids. She states she will be going out of town this weekend and would like to start antibiotics, if needed, before then. Discussed with Dr. Carlis Abbott, Cranford Mon, NP to write order for urinalysis and culture. Pt states she will go to lab first thing tomorrow morning to provide urine sample. She states the best number to contact her is [phone]. Will send to Dr. Carlis Abbott to inform.

## 2014-01-21 ENCOUNTER — Ambulatory Visit
Admit: 2014-01-21 | Discharge: 2014-01-21 | Disposition: A | Discharge status: Home / Self Care | Payer: Self-pay | Source: Ambulatory Visit | Attending: Internal Medicine | Admitting: Internal Medicine

## 2014-01-21 DIAGNOSIS — N390 Urinary tract infection, site not specified: Secondary | ICD-10-CM

## 2014-01-21 LAB — URINALYSIS WITH REFLEX TO MICROSCOPIC
Blood,UA: NEGATIVE
Ketones, UA: NEGATIVE
Nitrite,UA: NEGATIVE
Protein,UA: NEGATIVE mg/dL
Specific Gravity,UA: 1.017 (ref 1.002–1.030)
pH,UA: 7 (ref 5.0–8.0)

## 2014-01-21 LAB — URINE MICROSCOPIC (IQ200)
RBC,UA: <1 /hpf (ref 0–2)
WBC,UA: 12 /hpf — ABNORMAL HIGH (ref 0–5)

## 2014-01-22 LAB — AEROBIC CULTURE

## 2014-01-23 ENCOUNTER — Other Ambulatory Visit: Payer: Self-pay | Admitting: Internal Medicine

## 2014-01-23 ENCOUNTER — Telehealth: Payer: Self-pay | Admitting: Internal Medicine

## 2014-01-23 MED ORDER — CIPROFLOXACIN HCL 250 MG PO TABS *I*
250.0000 mg | ORAL_TABLET | Freq: Two times a day (BID) | ORAL | Status: AC
Start: 2014-01-23 — End: 2014-01-28

## 2014-01-23 NOTE — Telephone Encounter (Signed)
Cranford Mon , NP spoke with pt   She is all set

## 2014-01-23 NOTE — Telephone Encounter (Signed)
-----   Message from Mammie Lorenzo sent at 01/23/2014  9:06 AM EST -----      ----- Message -----     From: Federico Flake, NP     Sent: 01/23/2014   8:08 AM       To: [facility] Team    Please call patient she does have a UTI and it is sensitive to cipro I will send a Rx to her pharm for 5 days of cipro 250 mg twice a day.

## 2014-01-23 NOTE — Telephone Encounter (Signed)
Patient is calling in for her Lab Results. She can be reached at [phone]

## 2014-01-23 NOTE — Telephone Encounter (Signed)
Spoke with patient given results of urine culture prescribed Cipro 250 mg twice a day doe 5 days sent to her [facility].  She states she now has back pain  Will pick up her prescription after work. Federico Flake, NP

## 2014-01-29 ENCOUNTER — Telehealth: Payer: Self-pay | Admitting: Primary Care

## 2014-01-29 NOTE — Telephone Encounter (Signed)
Left message for pt to return call.

## 2014-01-29 NOTE — Telephone Encounter (Signed)
Pt called stating she finished Cipro for UTI, but still feeling some irritation and tingling, and feels like something is still not right, and that her right kidney may be affected.  She is concerned that the weekend is coming up, and would like a call back to advise her, and to see if she needs another course of medication.    She can be reached at [facility]  [phone], as her other phone is not working.  Please call her back, and OK to leave voicemail.

## 2014-01-30 ENCOUNTER — Ambulatory Visit: Payer: Self-pay | Admitting: Primary Care

## 2014-01-30 VITALS — BP 120/76 | HR 84 | Temp 98.1°F | Ht 63.39 in | Wt 148.8 lb

## 2014-01-30 DIAGNOSIS — N390 Urinary tract infection, site not specified: Secondary | ICD-10-CM

## 2014-01-30 LAB — POCT URINALYSIS DIPSTICK
Bilirubin,Ur: NEGATIVE
Blood,UA POCT: NEGATIVE
Glucose,UA POCT: NORMAL
Ketones,UA POCT: NEGATIVE
Leuk Esterase,UA POCT: NEGATIVE
Lot #: 22933002
Nitrite,UA POCT: NEGATIVE
PH,UA POCT: 5 (ref 5–8)
Protein,UA POCT: NEGATIVE mg/dL
Specific gravity,UA POCT: 1.015 (ref 1.002–1.03)
Urobilinogen,UA: NORMAL

## 2014-01-30 LAB — URINALYSIS WITH MICROSCOPIC
Blood,UA: NEGATIVE
Ketones, UA: NEGATIVE
Leuk Esterase,UA: NEGATIVE
Nitrite,UA: NEGATIVE
Protein,UA: NEGATIVE mg/dL
RBC,UA: <1 /hpf (ref 0–2)
Specific Gravity,UA: 1.008 (ref 1.002–1.030)
WBC,UA: NONE SEEN /hpf (ref 0–5)
pH,UA: 6 (ref 5.0–8.0)

## 2014-01-30 NOTE — Telephone Encounter (Signed)
Left vm at all numbers.  Patent needs to schedule appt with [facility] team resident

## 2014-01-30 NOTE — Telephone Encounter (Signed)
Discussion with patient per request of access center.  Patient reporting urinary symptoms with kidney area pain and frequent urination. Has finished antibiotics previously ordered.  SIM appt Urgent care set up for today at 330pm.

## 2014-01-30 NOTE — Patient Instructions (Signed)
The urine sample from last week was positive for a urinary tract infection but the bacteria was sensitive to Cipro. You were adequately treated for this infection with Cipro. Your urine sample from today did not show signs of a urinary tract infection. We will send today's urine to the lab for culture, which should take 48 hours, and will call you if the urine culture shows something concerning. We do not know why you continue to have discomfort. Please see your OBGYN for a full pelvic exam and further workup. Please call us if you develop fever, chills, back pain, blood in urine, or burning or pain with urination.

## 2014-01-30 NOTE — Progress Notes (Addendum)
[facility] Progress Note    Reason For Visit:   Chief Complaint   Patient presents with    Urinary Tract Infection     Subjective:      Danielle Frye is 58 y.o. year old female with PMH of prior UTI, menopause and pre-HTN, presenting to urgent care for evaluation for UTI.     Danielle Frye developed urinary frequency, cloudy appearing urine, and pelvic discomfort on 01/20/2014. She called the office that day and was sent to the lab to provide a urine sample. Lab results showed that her urine grew Citrobacter that was I to Macrobid and otherwise pan-sensitive. On 3/5 our office called a prescription for Cipro 250mg  BID x 5 days to [facility]. Danielle Frye reports picking up the cipro on 3/5 and took the entire 5 days as directed. One day after starting the Cipro, she developed right flank pain which lasted for 3 days, but no fever or chills. She finished Cipro 4 days ago and felt well that day. Yesterday she developed urinary frequency, and a "strange feeling" which she described as discomfort and "feeling her underwear are too tight". Current symptoms are different than her prior UTIs. She denies any change in appearance of her urine, blood in urine, burning or pain with urination, flank pain, or fevers/chills. She is worried that she still has a urinary tract infection.     In the past, Danielle Frye has had 3 UTIs in her life, which she describes as feeling well one day and then suddenly having a toilet bowl full of blood. She denies having frequent urinary discomfort. She is post-menopausal and takes estradiol-progesterone to maintain her vaginal lining as she had previously been found to have vaginal atrophy. She saw her OBGYN in December and was told her exam was normal. She is sexually active with a partner, and does not use any contraception, but reports they are monogamous and had both tested negative for STIs. She denies any vaginal discharge, foul odor, or any other vaginal changes. She recently  returned from a vacation on Monday.    Medications:     Current Outpatient Prescriptions on File Prior to Visit   Medication Sig Dispense Refill    omeprazole (PRILOSEC) 20 MG capsule Take 40 mg by mouth daily (with dinner)   TAke 1/2 hr before dinner        Calcium-Vitamin D-Vitamin K (VIACTIV) 500-500-40 MG-UNT-MCG CHEW CHEW ONE TABLET BY MOUTH THREE TIMES DAILY    0    Cholecalciferol (VITAMIN D) 1000 UNITS capsule TAKE 2 CAPSULE DAILY    0     No current facility-administered medications on file prior to visit.     Medications reviewed and reconciled.  Also takes estradiol-progesterone prescribed by her OBGYN.   Allergies:     Allergies   Allergen Reactions    Latex      Created by Conversion - Rash;     Sulfa Drugs      Created by Conversion - Hives;        Review of Systems:     Pertinent positives and negatives as per HPI.     Physical Exam:     Filed Vitals:    01/30/14 1546   BP: 120/76   Pulse: 84   Temp: 36.7 C (98.1 F)   TempSrc: Temporal   Height: 1.61 m (5' 3.39")   Weight: 67.495 kg (148 lb 12.8 oz)     Wt Readings from Last 3 Encounters:   01/30/14  67.495 kg (148 lb 12.8 oz)   07/15/13 63.594 kg (140 lb 3.2 oz)   05/16/13 63.05 kg (139 lb)     BP Readings from Last 3 Encounters:   01/30/14 120/76   07/15/13 124/78   05/16/13 110/76       General: Well appearing white female in no apparent distress.  See VS.  Afebrile.  Pulmonary: CTA B/L. No wheezes, rhonchi, or rales.  Cardiovascular:  RRR. No murmurs, rubs, or gallops.   Abdominal: Soft. Non-distended. Non-tender. Bowel sounds present. No CVA tenderness. No suprapubic tenderness  GU: Vaginal normal appearing, mucosa moist, no erythema or discharge, no lesions    Labs/tests/imaging: reviewed  - UA 01/21/2014: 1+ cloudy, neg blood, protein neg, nitrite neg, LE 1+, RBC <1, WBC 12, bacteria 1+  - U Cx 01/21/2014: Citrobacter koseri >100,000/ml, I macrobic other wise pan sensitive  - UA 3/12: Normal, no LE/nitrite/blood    Assessment and Plan:      Danielle Frye was seen today for urinary frequency and vaginal discomfort x 1 day, s/p a UTI that was treated with Cipro x 5 days with cultures showing pansensitive Citrobacter. Danielle Frye's labs are c/w a recent urinary tract infection but her UA today was negative for UTI. Her vaginal exam did not reveal any lesions or atrophy externally. Her discomfort may be resolving UTI. Nephrolithiasis, interstitial cystitis, pyelonephritis, vaginal infection, and vaginal atrophy are not c/w her exam or history. I advised her to see her OBGYN.    ? UTI (lower urinary tract infection)  - POCT Urinalysis Dipstick done, was normal  - Urinalysis with microscopic and Aerobic culture (urine-voided) sent to lab. Will call patient if these tests are c/w UTI.  - Hold off on antibiotics for now as no sign of UTI today and she was adequately treated for UTI with Cipro  -  Encouraged followup with OBGyn    Follow up prn, also return for annual physical.    Alecia Lemming, MD 01/30/2014 4:23 PM    I discussed this patient with Dr. Donnal Moat at the time of the visit.  I agree with the resident's assessment and plan of care as documented - no evidence of current UTI and GU inspection without any abnormality- will have pt f/u with GYN and make sure culture is negative.  Signed by Reita Chard. Carlis Abbott, M.D.

## 2014-01-30 NOTE — Telephone Encounter (Signed)
Left message for patient at number she specified.    Asked that she please call the office to be assessed and possibly schedule an appointment.

## 2014-01-30 NOTE — Telephone Encounter (Signed)
She needs an appt as the antibiotic she took should have taken care of her sx.  Please bring her in today or early tomorrow.  nsc

## 2014-01-31 LAB — AEROBIC CULTURE: Aerobic Culture: 0

## 2014-02-03 ENCOUNTER — Telehealth: Payer: Self-pay | Admitting: Primary Care

## 2014-02-03 NOTE — Telephone Encounter (Signed)
-----   Message from Mammie Lorenzo sent at 02/03/2014  8:19 AM EDT -----      ----- Message -----     From: Roxy Manns, MD     Sent: 02/01/2014   7:13 PM       To: [facility] Team    pls let pt know final urine culture showed no infection  nsc

## 2014-02-03 NOTE — Telephone Encounter (Signed)
second message left to call back regarding lab results

## 2014-02-05 NOTE — Telephone Encounter (Signed)
The pt was called and a message was left with normal urine results.  Pt instructed to call with any questions or concerns.

## 2014-03-18 ENCOUNTER — Encounter: Payer: Self-pay | Admitting: Gastroenterology

## 2014-03-18 LAB — HM DEXA SCAN

## 2014-03-31 ENCOUNTER — Encounter: Payer: Self-pay | Admitting: Primary Care

## 2014-06-09 ENCOUNTER — Ambulatory Visit: Payer: Self-pay | Admitting: Primary Care

## 2014-06-09 ENCOUNTER — Encounter: Payer: Self-pay | Admitting: Primary Care

## 2014-06-09 ENCOUNTER — Other Ambulatory Visit: Payer: Self-pay | Admitting: Gastroenterology

## 2014-06-09 VITALS — BP 136/84 | HR 92 | Temp 98.6°F | Ht 63.39 in | Wt 145.6 lb

## 2014-06-09 DIAGNOSIS — R0789 Other chest pain: Secondary | ICD-10-CM

## 2014-06-09 DIAGNOSIS — Z0000 Encounter for general adult medical examination without abnormal findings: Secondary | ICD-10-CM

## 2014-06-09 DIAGNOSIS — N390 Urinary tract infection, site not specified: Secondary | ICD-10-CM

## 2014-06-09 DIAGNOSIS — K30 Functional dyspepsia: Secondary | ICD-10-CM

## 2014-06-09 LAB — EKG 12-LEAD
P: 42 degrees
QRS: -21 degrees
Rate: 81 {beats}/min
Severity: BORDERLINE
Severity: BORDERLINE
Statement: BORDERLINE
Statement: BORDERLINE
T: 66 degrees

## 2014-06-09 MED ORDER — OMEPRAZOLE 20 MG PO CPDR *I*
20.0000 mg | DELAYED_RELEASE_CAPSULE | Freq: Every day | ORAL | Status: DC
Start: 2014-06-09 — End: 2016-06-16

## 2014-06-09 NOTE — Patient Instructions (Signed)
Please call Dr. Norton Pastel regarding stomach issues and make an appt.

## 2014-06-09 NOTE — H&P (Signed)
History and Physical    HISTORY:  No chief complaint on file.        History of Present Illness:    HPI   Pt here for CPE.  Still in relationship and very happy.  Had a UTI in march- treated with cipro but still had significant irritation- ultimately resolved in appox two weeks- repeat UA and culture were negative.    She does still have some nonulcer type of dyspepsia.  I reviewed with her that she was to return to see her gastroenterologist but she did not do so.  Given the persistence of her symptoms I have asked her to return to see him.  She is also quite worried about her daughter who apparently has something on her pancreas and has a followup with her new primary care physician this week.  She has ultimately recovered from her eye surgery and issues with pigmentation on her face.    Problems:  Patient Active Problem List   Diagnosis Code    Prehypertension 796.2    Asthmatic Bronchitis 493.90        Past Medical/Surgical History:   No past medical history on file.  Past Surgical History   Procedure Laterality Date    Knee surgery       Knee Surgery Conversion Data     Hysterectomy       Cesarean Hysterectomy Conversion Data     Tubal ligation       Tubal Ligation Conversion Data          Allergies:    Allergies   Allergen Reactions    Latex      Created by Conversion - Rash;     Sulfa Drugs      Created by Conversion - Hives;        Current medications:    Current Outpatient Prescriptions   Medication Sig    omeprazole (PRILOSEC) 20 MG capsule Take 40 mg by mouth daily (with dinner)   TAke 1/2 hr before dinner    Calcium-Vitamin D-Vitamin K (VIACTIV) 500-500-40 MG-UNT-MCG CHEW CHEW ONE TABLET BY MOUTH THREE TIMES DAILY    Cholecalciferol (VITAMIN D) 1000 UNITS capsule TAKE 2 CAPSULE DAILY       Family History:    Family History   Problem Relation Age of Onset    Conversion Other      20110506^Breast Cancer^V16.3^Active^    Conversion Other      20110506^Stroke Syndrome^436^Active^    Conversion  Other      [id number]^NWGNFAOZHYQM^578.9^Active^    Conversion Other      20110506^Adopted^^Active^       Social/Occupational History:   History     Social History    Marital Status: Single     Spouse Name: N/A     Number of Children: N/A    Years of Education: N/A     Social History Main Topics    Smoking status: Former Smoker     Types: Cigarettes     Start date: 05/17/1992    Smokeless tobacco: Never Used    Alcohol Use: None    Drug Use: None    Sexual Activity: None     Other Topics Concern    None     Social History Narrative         Review of Systems:    ROS   Review of Systems - General ROS: negative  Psychological ROS: negative  Ophthalmic ROS: negative  ENT ROS: negative  Allergy and Immunology  ROS: negative  Hematological and Lymphatic ROS: unchanged  Endocrine ROS: negative  Breast ROS: none  Respiratory ROS: no cough, shortness of breath, or wheezing  Cardiovascular ROS: occasional muscular type discomfort in chest wall anteriorly  Gastrointestinal ROS: see HPI  Genito-Urinary ROS: resolved  Musculoskeletal ROS: both shoulders  Neurological ROS: none  Dermatological ROS: negative    Vital Signs:   BP 136/84 mmHg   Pulse 92   Temp(Src) 37 C (98.6 F) (Temporal)   Ht 1.61 m (5' 3.39")   Wt 66.044 kg (145 lb 9.6 oz)   BMI 25.48 kg/m2      PHYSICAL EXAM:  Physical Exam   BP 136/84 mmHg   Pulse 92   Temp(Src) 37 C (98.6 F) (Temporal)   Ht 1.61 m (5' 3.39")   Wt 66.044 kg (145 lb 9.6 oz)   BMI 25.48 kg/m2    General Appearance:    Alert, cooperative, no distress, appears stated age   Head:    Normocephalic, without obvious abnormality, atraumatic   Eyes:    PERRL, conjunctiva/corneas clear, EOM's intact   Ears:    Normal TM's and external ear canals, both ears   Nose:   Nares normal, septum midline, mucosa normal, no drainage     or sinus tenderness   Throat:   Lips, mucosa, and tongue normal; teeth and gums normal   Neck:   Supple, symmetrical, trachea midline, no adenopathy;     thyroid:  no  enlargement/tenderness/nodules; no carotid    bruit or JVD   Back:     Symmetric, no curvature, ROM normal, no CVA tenderness   Lungs:     Clear to auscultation bilaterally, respirations unlabored   Chest Wall:    No tenderness or deformity    Heart:    Regular rate and rhythm, S1 and S2 normal, no murmur, rub    or gallop   Breast Exam:    No tenderness, masses, or nipple abnormality   Abdomen:     Soft, non-tender, bowel sounds active all four quadrants,     no masses, no organomegaly   Genitalia:    Per gyn   Rectal:    Per gyn   Extremities:   Extremities normal, atraumatic, no cyanosis or edema   Pulses:   2+ and symmetric all extremities   Skin:   Skin color, texture, turgor normal, no rashes or lesions   Lymph nodes:   Cervical, supraclavicular, and axillary nodes normal   Neurologic:   CNII-XII intact, normal strength, sensation and reflexes     throughout           Assessment:    Danielle Frye was seen today for CPE.    Diagnoses and associated orders for this visit:    Non-ulcer dyspepsia  - I have asked patient to followup with her gastroenterologist as she has persistent symptoms.  I have restarted her on Prilosec 20 mg half an hour prior to dinner.  I will also check pancreatic enzymes.  - Comprehensive metabolic panel; Future  - Amylase; Future  - Lipase; Future    UTI (urinary tract infection)  -  Currently resolved.    Atypical chest pain  - EKG is normal and certainly reassuring.  Patient relates her symptoms to her dyspepsia  - EKG 12 lead    Health care maintenance  - Lipid add Rfx to Drt LDL if Trig >400; Future  - CBC; Future  - Vitamin D; Future    Other Orders  -  omeprazole (PRILOSEC) 20 MG capsule; Take 1 capsule (20 mg total) by mouth daily (with dinner)   TAke 1/2 hr before dinner    I will plan to see patient back in 3 months time however should her GI symptoms persist or should she redevelop her urinary symptoms she will let me know and come back as needed.  I have ordered a fasting blood work  for her to do at her convenience.    Signed by Reita Chard. Carlis Abbott, M.D. 06/20/2014 at 4:41 PM

## 2014-09-03 ENCOUNTER — Ambulatory Visit
Admit: 2014-09-03 | Discharge: 2014-09-03 | Disposition: A | Discharge status: Home / Self Care | Payer: Self-pay | Source: Ambulatory Visit | Attending: Primary Care | Admitting: Primary Care

## 2014-09-03 DIAGNOSIS — Z0000 Encounter for general adult medical examination without abnormal findings: Secondary | ICD-10-CM

## 2014-09-03 DIAGNOSIS — K30 Functional dyspepsia: Secondary | ICD-10-CM

## 2014-09-03 LAB — COMPREHENSIVE METABOLIC PANEL
ALT: 13 U/L (ref 0–35)
AST: 20 U/L (ref 0–35)
Albumin: 4.2 g/dL (ref 3.5–5.2)
Alk Phos: 50 U/L (ref 35–105)
Anion Gap: 12 (ref 7–16)
Bilirubin,Total: 0.6 mg/dL (ref 0.0–1.2)
CO2: 27 mmol/L (ref 20–28)
Calcium: 8.8 mg/dL (ref 8.6–10.2)
Chloride: 103 mmol/L (ref 96–108)
Creatinine: 0.9 mg/dL (ref 0.51–0.95)
GFR,Black: 80 *
GFR,Caucasian: 70 *
Glucose: 89 mg/dL (ref 60–99)
Lab: 17 mg/dL (ref 6–20)
Potassium: 4.7 mmol/L (ref 3.3–5.1)
Sodium: 142 mmol/L (ref 133–145)
Total Protein: 6.6 g/dL (ref 6.3–7.7)

## 2014-09-03 LAB — LIPASE: Lipase: 28 U/L (ref 13–60)

## 2014-09-03 LAB — CBC
Hematocrit: 39 % (ref 34–45)
Hemoglobin: 13 g/dL (ref 11.2–15.7)
MCH: 31 pg/cell (ref 26–32)
MCHC: 33 g/dL (ref 32–36)
MCV: 94 fL (ref 79–95)
Platelets: 188 10*3/uL (ref 160–370)
RBC: 4.2 MIL/uL (ref 3.9–5.2)
RDW: 12.1 % (ref 11.7–14.4)
WBC: 2.6 10*3/uL — ABNORMAL LOW (ref 4.0–10.0)

## 2014-09-03 LAB — LIPID PANEL
Chol/HDL Ratio: 2.6
Cholesterol: 215 mg/dL — AB
HDL: 82 mg/dL
LDL Calculated: 123 mg/dL
Non HDL Cholesterol: 133 mg/dL
Triglycerides: 49 mg/dL

## 2014-09-03 LAB — AMYLASE: Amylase: 47 U/L (ref 28–100)

## 2014-09-05 LAB — VITAMIN D
25-OH VIT D2: <4 ng/mL
25-OH VIT D3: 39 ng/mL
25-OH Vit Total: 39 ng/mL (ref 30–60)

## 2014-09-09 ENCOUNTER — Encounter: Payer: Self-pay | Admitting: Primary Care

## 2014-09-09 ENCOUNTER — Ambulatory Visit: Payer: Self-pay | Admitting: Primary Care

## 2014-09-09 VITALS — BP 110/82 | HR 72 | Temp 98.4°F | Ht 63.39 in | Wt 150.3 lb

## 2014-09-09 DIAGNOSIS — D72819 Decreased white blood cell count, unspecified: Secondary | ICD-10-CM

## 2014-09-09 DIAGNOSIS — Z79890 Hormone replacement therapy (postmenopausal): Secondary | ICD-10-CM

## 2014-09-09 DIAGNOSIS — K30 Functional dyspepsia: Secondary | ICD-10-CM

## 2014-09-09 DIAGNOSIS — E559 Vitamin D deficiency, unspecified: Secondary | ICD-10-CM

## 2014-09-09 NOTE — Progress Notes (Signed)
Patient seen on 09/09/2014 at 9:31 AM      HPI  Chief Complaint   Patient presents with    Follow-up     abdominal discomfort, non-ulcer dyspepsia        Context: Danielle Frye is a 58 y.o. female who returns to see me for follow up.   Today she is feeling well.   She notes that her progesterone has changed manufacturers and the new formulation has appeared to lessen her abdominal discomfort. This change is significant enough that she has not needed to use her omeprazole often. She continues to keep omeprazole on hand for occasions when she is eating spicy foods - I have suggested that she try OTC zantac for this as well.   She continues to go to PT for her shoulder pain which has been helping.   We discussed her most recent lab work - cholesterol is a bit elevated and WBC are slightly lower than usual. Her current ASCVD risk is 2.0%.     We discussed need to f/u CBC and potential causes of leukopenia ( mild).    HISTORY  Patient's past medical history, medications, allergies, surgical history, family history, problem list and social history were all reviewed and updated in the medical record. Pertinent history has been included in the HPI.    HOME MEDS  Current Outpatient Prescriptions on File Prior to Visit   Medication Sig Dispense Refill    estradiol (ESTRACE) 0.5 MG tablet Take 0.5 mg by mouth daily      progesterone (PROMETRIUM) 100 MG capsule Take 100 mg by mouth daily      Cholecalciferol (VITAMIN D) 1000 UNITS capsule TAKE 2 CAPSULE DAILY  0    omeprazole (PRILOSEC) 20 MG capsule Take 1 capsule (20 mg total) by mouth daily (with dinner)   TAke 1/2 hr before dinner 30 capsule 3    Biotin 5000 MCG CAPS Take 1 tablet by mouth      Calcium-Vitamin D-Vitamin K (VIACTIV) 734-193-79 MG-UNT-MCG CHEW CHEW ONE TABLET BY MOUTH THREE TIMES DAILY  0     No current facility-administered medications on file prior to visit.       REVIEW OF SYSTEMS  Review of Systems   Gastrointestinal: Negative for abdominal  pain.   Musculoskeletal: Positive for joint pain (shoulder pain).         PHYSICAL EXAM  Vital signs reviewed in electronic record.  Filed Vitals:    09/09/14 0917   BP: 110/82   Pulse: 72   Temp: 36.9 C (98.4 F)   Height: 1.61 m (5' 3.39")   Weight: 68.176 kg (150 lb 4.8 oz)          General: She is pleasant and cooperative. No distress.    Cardiovascular: Regular rate and rhythm. No murmurs, rubs, or gallops.    Pulmonary: No respiratory distress. Clear to auscultation bilaterally. No wheezing, rales, or rhonchi.    Abdominal: Soft and non-tender. Bowel sounds active.   Extremities: Extremities normal. No cyanosis or edema.             ASSESSMENT AND PLAN  Danielle Frye is a 58 y.o. female who returns for follow up for or new evaluation of:    1. Leukopenia : Check CBC in 2-3 weeks to reassure WBC. If persistently low would have her see heme formally- other blood lines - rbc, plt stable.   2. Hormone replacement therapy : She will continue with progesterone 100 mg.  Interesting  that this resolved her GI issues- looked up in drug info and side effects are listed.   3. Non-ulcer dyspepsia : Improved with new medication formulation.    4. Vitamin D deficiency : Managed will with Vit D 2000 IU.          Medications/Orders:  Orders Placed This Encounter    CBC and differential        Crystalle should return to see me in six months.       Attestations:  Documented by Army Melia acting as a scribe for Dr. Verl Dicker 09/09/2014 9:57 AM     All medical record entries made by the Scribe were at my direction and personally dictated by me. I have reviewed the chart and agree that the record accurately reflects my personal performance of the history, physical exam, assessment and plan. I have also personally directed, reviewed, and agree with the discharge instructions. 09/09/2014 9:57 AM     Signed by Reita Chard. Carlis Abbott, M.D.

## 2014-09-10 ENCOUNTER — Encounter: Payer: Self-pay | Admitting: Gastroenterology

## 2014-09-16 ENCOUNTER — Telehealth: Payer: Self-pay | Admitting: Primary Care

## 2014-09-16 NOTE — Telephone Encounter (Signed)
Patient calling to get recommendation for a Geriatric doctor in the [place] area, preferably a female.   Patient also states she wants to let the doctor know she had been taking Aleeve for about a week when she had last seen her and forgot to report this, but wanted to have it noted. Patient can be reached at [phone].

## 2014-09-19 NOTE — Telephone Encounter (Signed)
Spoke to pt- referral was for a friend- gave names of physicians that might be appropriate- note the alleve- pt will have updated CBC  Signed by Reita Chard. Carlis Abbott, M.D. 09/19/2014 at 9:51 AM

## 2015-03-03 ENCOUNTER — Ambulatory Visit
Admit: 2015-03-03 | Discharge: 2015-03-03 | Disposition: A | Discharge status: Home / Self Care | Payer: Self-pay | Source: Ambulatory Visit | Attending: Primary Care | Admitting: Primary Care

## 2015-03-03 DIAGNOSIS — Z79890 Hormone replacement therapy (postmenopausal): Secondary | ICD-10-CM

## 2015-03-03 LAB — CBC AND DIFFERENTIAL
Baso # K/uL: 0 10*3/uL (ref 0.0–0.1)
Basophil %: 0.9 %
Eos # K/uL: 0 10*3/uL (ref 0.0–0.4)
Eosinophil %: 1.2 %
Hematocrit: 42 % (ref 34–45)
Hemoglobin: 13.9 g/dL (ref 11.2–15.7)
IMM Granulocytes #: 0 10*3/uL (ref 0.0–0.1)
IMM Granulocytes: 0.3 %
Lymph # K/uL: 1.4 10*3/uL (ref 1.2–3.7)
Lymphocyte %: 43.1 %
MCH: 31 pg/cell (ref 26–32)
MCHC: 33 g/dL (ref 32–36)
MCV: 95 fL (ref 79–95)
Mono # K/uL: 0.3 10*3/uL (ref 0.2–0.9)
Monocyte %: 8.9 %
Neut # K/uL: 1.5 10*3/uL — ABNORMAL LOW (ref 1.6–6.1)
Nucl RBC # K/uL: 0 10*3/uL (ref 0.0–0.0)
Nucl RBC %: 0 /100 WBC (ref 0.0–0.2)
Platelets: 197 10*3/uL (ref 160–370)
RBC: 4.4 MIL/uL (ref 3.9–5.2)
RDW: 12.1 % (ref 11.7–14.4)
Seg Neut %: 45.6 %
WBC: 3.3 10*3/uL — ABNORMAL LOW (ref 4.0–10.0)

## 2015-03-10 ENCOUNTER — Ambulatory Visit: Payer: Self-pay | Admitting: Primary Care

## 2015-03-10 ENCOUNTER — Encounter: Payer: Self-pay | Admitting: Primary Care

## 2015-03-10 ENCOUNTER — Ambulatory Visit
Admit: 2015-03-10 | Discharge: 2015-03-10 | Disposition: A | Discharge status: Home / Self Care | Payer: Self-pay | Source: Ambulatory Visit | Attending: Primary Care | Admitting: Primary Care

## 2015-03-10 VITALS — BP 138/82 | HR 84 | Temp 97.5°F | Ht 63.39 in | Wt 157.0 lb

## 2015-03-10 DIAGNOSIS — D72819 Decreased white blood cell count, unspecified: Secondary | ICD-10-CM

## 2015-03-10 DIAGNOSIS — R1013 Epigastric pain: Secondary | ICD-10-CM

## 2015-03-10 DIAGNOSIS — R197 Diarrhea, unspecified: Secondary | ICD-10-CM

## 2015-03-10 LAB — COMPREHENSIVE METABOLIC PANEL
ALT: 13 U/L (ref 0–35)
AST: 23 U/L (ref 0–35)
Albumin: 4.1 g/dL (ref 3.5–5.2)
Alk Phos: 63 U/L (ref 35–105)
Anion Gap: 12 (ref 7–16)
Bilirubin,Total: 0.4 mg/dL (ref 0.0–1.2)
CO2: 28 mmol/L (ref 20–28)
Calcium: 9.4 mg/dL (ref 8.6–10.2)
Chloride: 102 mmol/L (ref 96–108)
Creatinine: 0.8 mg/dL (ref 0.51–0.95)
GFR,Black: 92 *
GFR,Caucasian: 80 *
Glucose: 104 mg/dL — ABNORMAL HIGH (ref 60–99)
Lab: 13 mg/dL (ref 6–20)
Potassium: 4.9 mmol/L (ref 3.3–5.1)
Sodium: 142 mmol/L (ref 133–145)
Total Protein: 6.8 g/dL (ref 6.3–7.7)

## 2015-03-10 LAB — AMYLASE: Amylase: 51 U/L (ref 28–100)

## 2015-03-10 LAB — LIPASE: Lipase: 36 U/L (ref 13–60)

## 2015-03-10 NOTE — Progress Notes (Signed)
Patient seen on 03/10/2015 at 9:27 AM      HPI  Chief Complaint   Patient presents with    Follow-up     non-ulcer dyspepsia, mild leukopenia        Context: Danielle Frye is a 59 y.o. female who returns to see me for follow up.   No issues with abdominal discomfort right now but she notes that last week she was having problems. Notes a "gnawing feeling" in her stomach which either feels as if she hasn't "eaten in days" or has been "punched". Describes rigidity of the stomach, excessive belching. Discomfort is unrelated to eating though it does occasionally wake her at night. No vomiting, but she has waves of nausea. No constipation, occasional diarrhea. She has been followed carefully by Dr. Jari Favre and had ecent endoscopy which revealed minimal inflammation of stomach and esophagus lining and suggests non ulcerative dyspepsia. H. Pylori was negative. Notes weight gain secondary to increased carb intake when her stomach was feeling better. She did try the antispasmodic given to her by GI which didn't work for her after about 2 weeks of taking it. She has omeprazole which she uses mostly PRN - has not tried a full 3 month course of it.   Was started on progesterone and estradiol before her stomach pain began and she wonders if this could be the cause of her discomfort - we had discussed having her talk about this with GYN last time and she has again been encouraged to do this.  She is considering switching her GYN.     Lab work continues to show slightly low WBC - suspect this is a normal trend for her as it is stable from the last few times.     HISTORY  Patient's past medical history, medications, allergies, surgical history, family history, problem list and social history were all reviewed and updated in the medical record. Pertinent history has been included in the HPI.    HOME MEDS  Current Outpatient Prescriptions on File Prior to Visit   Medication Sig Dispense Refill    omeprazole (PRILOSEC) 20 MG  capsule Take 1 capsule (20 mg total) by mouth daily (with dinner)   TAke 1/2 hr before dinner 30 capsule 3    Biotin 5000 MCG CAPS Take 1 tablet by mouth      estradiol (ESTRACE) 0.5 MG tablet Take 0.5 mg by mouth daily      progesterone (PROMETRIUM) 100 MG capsule Take 100 mg by mouth daily      Calcium-Vitamin D-Vitamin K (VIACTIV) 500-500-40 MG-UNT-MCG CHEW CHEW ONE TABLET BY MOUTH THREE TIMES DAILY  0    Cholecalciferol (VITAMIN D) 1000 UNITS capsule TAKE 2 CAPSULE DAILY  0     No current facility-administered medications on file prior to visit.       REVIEW OF SYSTEMS  Review of Systems   Gastrointestinal: Positive for nausea, abdominal pain and diarrhea. Negative for vomiting and constipation.         PHYSICAL EXAM  Vital signs reviewed in electronic record.  Filed Vitals:    03/10/15 0906   BP: 138/82   Pulse: 84   Temp: 36.4 C (97.5 F)   Height: 1.61 m (5' 3.39")   Weight: 71.215 kg (157 lb)          General: She is pleasant and cooperative. No distress.    HEENT: Neck is supple, no adenopathy.    Cardiovascular: Regular rate and rhythm. No murmurs, rubs, or  gallops.    Pulmonary: No respiratory distress. Clear to auscultation bilaterally. No wheezing, rales, or rhonchi.    Abdominal: Soft and non-tender. Bowel sounds active all four quadrants.   No masses, no organomegaly.   Extremities: Extremities normal. No cyanosis or edema.                 ASSESSMENT AND PLAN  Danielle Frye is a 59 y.o. female who returns for follow up for or new evaluation of:    1. Midepigastric pain : Suspect IBS or functional dyspepsia. Need to r/o pancreatic issue with CT abd. to best visualize the pancreas. Today will check CMP, pancreatic function, celiac screen. She has again been encouraged to see GYN about trial off of estradiol/progesterone.    2. Diarrhea : Will also check stool for parasites per Pt request.    3. Leukopenia : Stable.              Medications/Orders:  Orders Placed This Encounter    Aerobic  culture and Shiga toxin (Stool)    O&P Exam, NO foreign travel (Stool)    Giardia/Cryptosporidium Antigen    CT abdomen without and with contrast    Comprehensive metabolic panel    Amylase    Lipase    Celiac screen        Keelin should return to see me in three months.       Attestations:  Documented by [employer] Melia acting as a scribe for Dr. Verl Dicker 03/10/2015 9:53 AM     All medical record entries made by the Scribe were at my direction and personally dictated by me. I have reviewed the chart and agree that the record accurately reflects my personal performance of the history, physical exam, assessment and plan. I have also personally directed, reviewed, and agree with the discharge instructions. 03/10/2015 9:53 AM     Signed by Reita Chard. Carlis Abbott, M.D.

## 2015-03-10 NOTE — Patient Instructions (Signed)
Please speak with your GYN about trialing off hormonal therapy as your abdominal symptoms started shortly after taking these medications    You may wish to try Dr. Brenton Grills Burke/ Dr. Satira Sark

## 2015-03-11 LAB — TISSUE TRANSGLUT,IGA
IgA: 179 mg/dL (ref 70–400)
tTG,IgA: 4.5 AB/Units (ref 0.0–19.9)

## 2015-03-16 ENCOUNTER — Telehealth: Payer: Self-pay | Admitting: Primary Care

## 2015-03-16 NOTE — Telephone Encounter (Signed)
Call to Danielle Frye to give appointment scheduled for a CT Abdomen as requested at last office visit.     Danielle Frye is scheduled on May 3rd at 9:00am.     Prep for this exam: NPO 2 hrs    This test is located at [place], [place] [address]     Should Danielle Frye need to reschedule, she can call [phone].    Call outcome:  Left detailed VM on identified VM

## 2015-03-20 ENCOUNTER — Telehealth: Payer: Self-pay | Admitting: Primary Care

## 2015-03-20 NOTE — Telephone Encounter (Signed)
-----   Message from [facility], RN sent at 03/20/2015 11:34 AM EDT -----      ----- Message -----     From: Lowella Curb     Sent: 03/20/2015  11:27 AM       To: Onward A        ----- Message -----     From: Roxy Manns, MD     Sent: 03/20/2015  11:18 AM       To: [facility] Team    Please let pt know that all of her blood work is normal that I checked at her visit- this is good news.  nsc

## 2015-03-20 NOTE — Telephone Encounter (Signed)
Left detailed message for pt on both numbers relaying message from Dr. Carlis Abbott  Left callback number for any questions/concerns

## 2015-03-24 ENCOUNTER — Other Ambulatory Visit: Payer: Self-pay | Admitting: Primary Care

## 2015-03-24 DIAGNOSIS — R1013 Epigastric pain: Secondary | ICD-10-CM

## 2015-03-25 NOTE — Telephone Encounter (Signed)
Pt rescheduled for 5/11

## 2015-04-01 ENCOUNTER — Ambulatory Visit
Admit: 2015-04-01 | Discharge: 2015-04-01 | Disposition: A | Discharge status: Home / Self Care | Payer: Self-pay | Source: Ambulatory Visit | Attending: Primary Care | Admitting: Primary Care

## 2015-04-01 DIAGNOSIS — R197 Diarrhea, unspecified: Secondary | ICD-10-CM

## 2015-04-02 LAB — GIARDIA/CRYPTOSPORIDIUM ANTIGEN: Giardia/Cryptosporidium Antigen: 0

## 2015-04-02 LAB — SHIGA TOXIN: Shiga Toxin: 0

## 2015-04-02 NOTE — Telephone Encounter (Signed)
Pt attended appt.

## 2015-04-03 LAB — AEROBIC CULTURE: Aerobic Culture: 0

## 2015-04-09 ENCOUNTER — Telehealth: Payer: Self-pay | Admitting: Primary Care

## 2015-04-09 NOTE — Telephone Encounter (Signed)
Patient calling stating that she is returning Dr Ainsley Spinner call regarding lab results.  Please call back before 2:45PM if possible.  Patient can be reached at [phone].

## 2015-04-09 NOTE — Telephone Encounter (Signed)
Reviewed CT scan - will need f/u chest CT in one year  nsc

## 2015-06-04 ENCOUNTER — Encounter: Payer: Self-pay | Admitting: Primary Care

## 2015-06-04 ENCOUNTER — Ambulatory Visit: Payer: Self-pay | Admitting: Primary Care

## 2015-06-04 ENCOUNTER — Telehealth: Payer: Self-pay | Admitting: Primary Care

## 2015-06-04 VITALS — BP 112/64 | HR 92 | Temp 97.5°F | Ht 63.39 in | Wt 152.2 lb

## 2015-06-04 DIAGNOSIS — R911 Solitary pulmonary nodule: Secondary | ICD-10-CM

## 2015-06-04 DIAGNOSIS — K30 Functional dyspepsia: Secondary | ICD-10-CM

## 2015-06-04 DIAGNOSIS — M7502 Adhesive capsulitis of left shoulder: Secondary | ICD-10-CM

## 2015-06-04 DIAGNOSIS — D72819 Decreased white blood cell count, unspecified: Secondary | ICD-10-CM

## 2015-06-04 LAB — CBC
Hematocrit: 40 % (ref 34–45)
Hemoglobin: 13.4 g/dL (ref 11.2–15.7)
MCH: 31 pg/cell (ref 26–32)
MCHC: 34 g/dL (ref 32–36)
MCV: 91 fL (ref 79–95)
Platelets: 184 10*3/uL (ref 160–370)
RBC: 4.4 MIL/uL (ref 3.9–5.2)
RDW: 12 % (ref 11.7–14.4)
WBC: 3.1 10*3/uL — ABNORMAL LOW (ref 4.0–10.0)

## 2015-06-04 NOTE — Progress Notes (Signed)
Patient seen on 06/04/15 at 9:37 AM      HPI  Chief Complaint   Patient presents with    Follow-up     non ulcer dyspepsia        Context: Danielle Frye is a 59 y.o. female who returns to see me for follow up.   Her abdominal pain is feeling better today though she isn't sure why. CT a/p done to evaluate the abdominal pain showed some pulmonary nodules so this will be rechecked next year. No issues found with the pancreas- pt was worried about pancreatic issues. Hyperplastic polyp was visualized, she will be due for cscope next year and we will check on this at that time. No issues with BMs. She has been closley followed by her gastroeneterologist    Established with a new GYN since her last visit. Continuing on estradiol and progesterone per GYN though she will be reducing these doses over the next few months.   Some left shoulder pain which began about 7 months ago, though she had just slept wrong on the shoulder. Occurs most often when she is bending down and reaching out to grab something. Also finds that she cannot rotate the joint back all the way.    HISTORY  Patient's past medical history, medications, allergies, surgical history, family history, problem list and social history were all reviewed and updated in the medical record. Pertinent history has been included in the HPI.    HOME MEDS  Current Outpatient Prescriptions on File Prior to Visit   Medication Sig Dispense Refill    omeprazole (PRILOSEC) 20 MG capsule Take 1 capsule (20 mg total) by mouth daily (with dinner)   TAke 1/2 hr before dinner 30 capsule 3    Biotin 5000 MCG CAPS Take 1 tablet by mouth      estradiol (ESTRACE) 0.5 MG tablet Take 0.5 mg by mouth daily      progesterone (PROMETRIUM) 100 MG capsule Take 100 mg by mouth daily      Calcium-Vitamin D-Vitamin K (VIACTIV) 500-500-40 MG-UNT-MCG CHEW CHEW ONE TABLET BY MOUTH THREE TIMES DAILY  0    Cholecalciferol (VITAMIN D) 1000 UNITS capsule TAKE 2 CAPSULE DAILY  0     No  current facility-administered medications on file prior to visit.        REVIEW OF SYSTEMS  Review of Systems   Gastrointestinal: Negative for abdominal pain.   Musculoskeletal: Positive for joint pain (left shoulder).         PHYSICAL EXAM  Vital signs reviewed in electronic record.  Vitals:    06/04/15 0914   BP: 112/64   Pulse: 92   Temp: 36.4 C (97.5 F)   Weight: 69 kg (152 lb 3.2 oz)   Height: 1.61 m (5' 3.39")          General: She is pleasant and cooperative. No distress.    Cardiovascular: Regular rate and rhythm. No murmurs, rubs, or gallops.    Pulmonary: No respiratory distress. Clear to auscultation bilaterally. No wheezing, rales, or rhonchi.    Abdominal: Soft and non-tender. Bowel sounds active.   Extremities: Extremities normal. No cyanosis or edema. Left shoulder - decreased ROM with ABduction, internal and external rotation.            ASSESSMENT AND PLAN  Danielle Frye is a 59 y.o. female who returns for follow up for or new evaluation of:    1. Adhesive capsulitis of left shoulder : Sent to Dr.  Nicandri of ortho. Most likely frozen shoulder and she will probably need to go to PT. May need injections as well. ? Underlying rotator cuff tear.    2. Leukopenia, unspecified type : Recheck CBC, this has been stable.   3. Non-ulcer dyspepsia : Resolved.    4. Pulmonary nodule : Recheck CT in one year.          Medications/Orders:  Orders Placed This Encounter    CBC    AMB REFERRAL TO ORTHOPEDIC SURGERY        Danielle Frye should return to see me in 9 months for CPE.       Attestations:  Documented by [employer] Melia acting as a scribe for Dr. Verl Dicker 06/04/15 9:48 AM     All medical record entries made by the Scribe were at my direction and personally dictated by me. I have reviewed the chart and agree that the record accurately reflects my personal performance of the history, physical exam, assessment and plan. I have also personally directed, reviewed, and agree with the discharge  instructions. 06/04/15 9:48 AM     Signed by Reita Chard. Carlis Abbott, M.D. 06/08/2015 at 1:09 PM

## 2015-06-04 NOTE — Telephone Encounter (Signed)
Left message for pt informing that lab results are all stable, per Dr. Carlis Abbott.

## 2015-06-11 ENCOUNTER — Other Ambulatory Visit: Payer: Self-pay | Admitting: Sleep Medicine

## 2015-06-11 ENCOUNTER — Ambulatory Visit: Payer: Self-pay | Admitting: Sports Medicine

## 2015-06-11 ENCOUNTER — Encounter: Payer: Self-pay | Admitting: Sports Medicine

## 2015-06-11 VITALS — BP 124/83 | HR 87 | Ht 63.0 in | Wt 150.0 lb

## 2015-06-11 DIAGNOSIS — M25512 Pain in left shoulder: Secondary | ICD-10-CM

## 2015-06-11 NOTE — Progress Notes (Signed)
History:  Danielle Frye is a 59 y.o. that is being seen as a consult request from Dr. Carlis Abbott, Reita Chard, MD for evaluation of her Left shoulder pain.  She does not remember any particular injury.  The symptoms first occured in January 2016.  Initially it just hurt her to sleep at night, now it bothers her with certain movements and she has noticed a little stiffness. The location of the pain is anterior and posterior.  Aggravating factors include:movement.  Alleviating factors include:  rest.    Past medical history, past surgical history, medications, allergies, family history, social history, and review of systems were reviewed today and have been documented separately in this encounter.      Physical Examination:  She is in no acute distress.  She is alert and oriented x 3.   Examination of her neck demonstrates normal cervical range of motion with no midline and no paraspinal tenderness.    The left shoulder demonstrates forward elevation to 160 degrees. She externally rotates to 60 degrees. She internally rotates to L1. She has grade 4/5 supraspinatus strength.  She has grade 4/5 infraspinatus strength and grade 5/5 subscapularis strength.  Hawkins test was positive.  Neer test was positive.  There is not tenderness at the [facility] joint. There is not tenderness along the biceps. Distally she is neurovascularly intact. She has tenderness in the trapezius and along the medial border of the scapula.    Imaging: I personally reviewed the patients images. X-Rays demonstrate mild AC joint OA, preserved glenohumeral joint.     Assessment:  Left shoulder subacromial bursitis vs rotator cuff injury.    Plan: I discussed the diagnosis and the treatment options with the patient.  We discussed ultrasound evaluation today and possible cortisone injection.  She wished to proceed with ultrasound evaluation.    Examiner :  Jae Dire, MD    Equipment : Sonosite M-Turbo, [employer].    Ultrasound was utilized to  provide real time diagnostic feedback with potential to elicit dynamic changes in pertinent anatomy, and within Medicare guidelines.    Technique: With the patient in the upright position multiple static and dynamic images were obtained of the left shoulder using the linear transducer.  Appropriate images were saved and archived.     Findings : Short axis view of the biceps shows  no tear of the tendon without fluid in the sheath and negative subluxation. Short  and long axis view of the subscapularis shows no tear of the tendon. Short and long axis views of the supraspinatus shows tendinopathy of the tendon. With dynamic resistance testing there is no retraction of the tendon. Short and long axis views of the infraspinatus shows tendinopathy of the tendon. With dynamic resistance testing there is no retraction of the tendon. There is small joint effusion. Evaluation of the posterior labrum shows no tear.    She wished to proceed with a course of PT and chiropractic care. She will follow up in 6-8 weeks.    Emmaline Kluver, PA, performed the duties of a scribe for this encounter in the presence of the documenting physician. This patient was seen by Dr. Lenn Cal who personally examined the patient and determined the plan of care.

## 2015-07-08 ENCOUNTER — Encounter: Payer: Self-pay | Admitting: Gastroenterology

## 2015-07-08 ENCOUNTER — Ambulatory Visit
Admission: RE | Admit: 2015-07-08 | Discharge: 2015-07-08 | Disposition: A | Discharge status: Home / Self Care | Payer: Self-pay | Source: Ambulatory Visit | Attending: Gastroenterology | Admitting: Gastroenterology

## 2015-07-08 LAB — HM COLONOSCOPY

## 2015-07-10 ENCOUNTER — Encounter: Payer: Self-pay | Admitting: Primary Care

## 2015-07-10 LAB — SURGICAL PATHOLOGY

## 2015-07-18 ENCOUNTER — Encounter: Payer: Self-pay | Admitting: Gastroenterology

## 2015-08-04 ENCOUNTER — Ambulatory Visit: Payer: Self-pay | Admitting: Sports Medicine

## 2015-09-01 ENCOUNTER — Ambulatory Visit: Payer: Self-pay | Admitting: Sports Medicine

## 2015-09-10 ENCOUNTER — Ambulatory Visit
Admission: RE | Admit: 2015-09-10 | Discharge: 2015-09-10 | Disposition: A | Discharge status: Home / Self Care | Payer: Self-pay | Source: Ambulatory Visit | Attending: Chiropractic Medicine | Admitting: Chiropractic Medicine

## 2015-09-10 LAB — HIGH SENSITIVITY CRP: CRP,High Sensitivity: 1.7 mg/L

## 2015-09-10 LAB — SEDIMENTATION RATE, AUTOMATED: Sedimentation Rate: 7 mm/hr (ref 0–30)

## 2015-09-10 LAB — VITAMIN B12: Vitamin B12: 389 pg/mL (ref 211–946)

## 2015-09-11 LAB — HOMOCYSTEINE: Homocysteine: 15 umol/L (ref 0–15)

## 2015-09-15 LAB — VITAMIN D
25-OH VIT D2: <4 ng/mL
25-OH VIT D3: 40 ng/mL
25-OH Vit Total: 40 ng/mL (ref 30–60)

## 2015-09-15 LAB — MAGNESIUM, RBC: Magnesium RBC: 2.1 mmol/L (ref 1.5–3.1)

## 2015-09-16 ENCOUNTER — Other Ambulatory Visit: Payer: Self-pay | Admitting: Gastroenterology

## 2015-09-18 ENCOUNTER — Encounter: Payer: Self-pay | Admitting: Gastroenterology

## 2016-03-03 ENCOUNTER — Ambulatory Visit: Payer: Self-pay | Admitting: Primary Care

## 2016-03-14 ENCOUNTER — Telehealth: Payer: Self-pay | Admitting: Primary Care

## 2016-03-14 DIAGNOSIS — R918 Other nonspecific abnormal finding of lung field: Secondary | ICD-10-CM

## 2016-03-14 NOTE — Telephone Encounter (Signed)
Pt had appt with me 4/13 and was cancelled- she is due for one year f/u for pulm nodules found on abd CT from last year in May.  I am placing order for repeat chest CT for mAY.  Please let me know if she has questions  nsc

## 2016-03-15 NOTE — Telephone Encounter (Addendum)
Call to Ms. Orrin Brigham to give appointment scheduled for a CT Chest as requested at last office visit.     Ms. Orrin Brigham is scheduled on May 9th at 10:45am.     Prep for this exam: NPO 2 hrs    This test is located at [facility], [place].     Should Ms. Orrin Brigham need to reschedule, she can call [phone].    Call outcome: Attempted to contact pt, no answer. Left VM for cb.

## 2016-03-28 NOTE — Telephone Encounter (Signed)
Attempted to contact pt, no answer. Left VM for cb.

## 2016-03-29 ENCOUNTER — Ambulatory Visit
Admission: RE | Admit: 2016-03-29 | Discharge: 2016-03-29 | Disposition: A | Discharge status: Home / Self Care | Payer: Self-pay | Source: Ambulatory Visit | Attending: Primary Care | Admitting: Primary Care

## 2016-03-30 ENCOUNTER — Telehealth: Payer: Self-pay

## 2016-03-30 NOTE — Telephone Encounter (Signed)
Left a detailed VM for the pt, informed the pt that chest CT looks good and Dr. Carlis Abbott looks forward to seeing pt in July. Provided an office call back number for any further questions/concerns.

## 2016-03-30 NOTE — Telephone Encounter (Signed)
-----   Message from Lowella Curb sent at 03/30/2016  1:58 PM EDT -----      ----- Message -----     From: Roxy Manns, MD     Sent: 03/30/2016  11:52 AM       To: [facility] Team    Please let pt know chest CT looks good and I will look forward to seeing her in July.  nsc

## 2016-04-28 ENCOUNTER — Telehealth: Payer: Self-pay | Admitting: Primary Care

## 2016-04-28 DIAGNOSIS — M8580 Other specified disorders of bone density and structure, unspecified site: Secondary | ICD-10-CM

## 2016-04-28 NOTE — Telephone Encounter (Signed)
Hello Dr. Clark -    Please advise on previous encounter, thank you.

## 2016-04-28 NOTE — Telephone Encounter (Signed)
Danielle Frye had a bone scan at [facility] in April 2015.  She called to schedule another one and they said a requisition will be needed from Dr. Carlis Abbott.  Danielle Frye is under the impression that she needs a bone scan every 2 years.  If that is not the case, please call and let her know.  If Dr. Carlis Abbott feels she should have a bone scan, the requisition can be faxed to [phone].

## 2016-04-29 NOTE — Telephone Encounter (Signed)
Please let pt know DEXA is ordered  nsc

## 2016-04-29 NOTE — Telephone Encounter (Signed)
Please set up order for DEXA scan at [facility] and I will sign  Dx: osteopenia  Tx,  nsc

## 2016-05-02 NOTE — Telephone Encounter (Signed)
Attempted to contact pt, no answer. Left VM advising order has been faxed to requested office.

## 2016-05-05 ENCOUNTER — Other Ambulatory Visit: Payer: Self-pay | Admitting: Gastroenterology

## 2016-05-05 LAB — HM DEXA SCAN

## 2016-05-09 ENCOUNTER — Encounter: Payer: Self-pay | Admitting: Primary Care

## 2016-06-16 ENCOUNTER — Encounter: Payer: Self-pay | Admitting: Primary Care

## 2016-06-16 ENCOUNTER — Ambulatory Visit: Payer: Self-pay | Admitting: Primary Care

## 2016-06-16 VITALS — BP 122/80 | HR 72 | Temp 97.3°F

## 2016-06-16 DIAGNOSIS — R42 Dizziness and giddiness: Secondary | ICD-10-CM

## 2016-06-16 DIAGNOSIS — Z0000 Encounter for general adult medical examination without abnormal findings: Secondary | ICD-10-CM

## 2016-06-16 DIAGNOSIS — Z9889 Other specified postprocedural states: Secondary | ICD-10-CM | POA: Insufficient documentation

## 2016-06-16 DIAGNOSIS — M8580 Other specified disorders of bone density and structure, unspecified site: Secondary | ICD-10-CM

## 2016-06-16 DIAGNOSIS — R918 Other nonspecific abnormal finding of lung field: Secondary | ICD-10-CM

## 2016-06-16 DIAGNOSIS — K2100 Gastro-esophageal reflux disease with esophagitis, without bleeding: Secondary | ICD-10-CM

## 2016-06-16 NOTE — Progress Notes (Signed)
Patient seen on 06/16/16 at 9:13 AM      HPI  Chief Complaint   Patient presents with    Follow-up     CPE        Context: Danielle Frye is a 60 y.o. female who returns to see me for CPE.   Reviewed DEXA completed last month - notable for osteopenia with FRAX 1.0% at hip an 9.0% overall. Pt not currently on bisphosphonate. Last vitamin D level was at 40.   Recently had chest CT to monitor pulmonary nodule; reviewed results which are reassuring and no further f/u per Fleischner guidelines recommended per radiology.  Has been seeing Dr. Lavone Neri; now attempting to taper off estradiol and progesterone. She had been able to get down to every 4th day but began having hot flashes again. Went back up to every day for a bit and is now back to every other day.     Has issues with sleep every other day in accordance with hormone therapy but she knows that this is expected.     Has had reflux/GERD in the past which was problematic about 4 months ago. Pt did trial of PPI and ?antispaszmotic; combination did not work for her. Now finding that zantac works for her and she has been able to taper off of it an now uses prn.     Notes that a few days ago she experienced an episode of dizziness when she woke during the night to urinate. Experienced chills and had a glass of water. Says she ate later than usual the night beforehand. Feels slightly dizzy today as well- it comes and goes. Pt unsure if she experienced lightheadedness. Sx not consistent; they come and go. BP has been good. Denies fevers. Says that turning or rolling in bed does not aggravate sx.     Notes new lesion on her finger - initially had a sliver after using steel wool. She says that a pustule formed and she attempted to extract some foreign body with a needle; new lesion developed in the area yesterday. Lesion dark in color. Asks if she should see derm.     Reviewed labs - overall have been good. Up to date on cscope. Mammogram scheduled for later this year.  Up to date on eye exam, dental care. Compliant with safety precautions with exception of helmets - dicussed together.       HISTORY  Patient's past medical history, medications, allergies, surgical history, family history, problem list and social history were all reviewed and updated in the medical record. Pertinent history has been included in the HPI.    HOME MEDS  Current Outpatient Prescriptions on File Prior to Visit   Medication Sig Dispense Refill    estradiol (ESTRACE) 0.5 MG tablet Take 0.5 mg by mouth every other day         progesterone (PROMETRIUM) 100 MG capsule Take 100 mg by mouth daily      Cholecalciferol (VITAMIN D) 1000 UNITS capsule TAKE 2 CAPSULE DAILY  0    Biotin 5000 MCG CAPS Take 1 tablet by mouth      Calcium-Vitamin D-Vitamin K (VIACTIV) [id number] MG-UNT-MCG CHEW CHEW ONE TABLET BY MOUTH THREE TIMES DAILY  0     No current facility-administered medications on file prior to visit.        REVIEW OF SYSTEMS  ROS   Review of Systems -   General ROS: negative  Ophthalmic ROS: negative  ENT ROS: negative  Allergy and  Immunology ROS: negative  Hematological and Lymphatic ROS: negative  Endocrine ROS: negative  Respiratory ROS: no cough, shortness of breath, or wheezing  Cardiovascular ROS: no chest pain or dyspnea on exertion  Gastrointestinal ROS: no abdominal pain, change in bowel habits, or black or bloody stools  Genito-Urinary ROS: no dysuria, trouble voiding, or hematuria  Musculoskeletal ROS: negative  Neurological ROS: negative  Dermatological ROS: negative       PHYSICAL EXAM  Vital signs reviewed in electronic record.  Vitals:    06/16/16 0900   BP: 122/80   Pulse:    Temp:         General Appearance:    Alert, cooperative, no distress, appears stated age   Head:    Normocephalic, without obvious abnormality, atraumatic   Eyes:    PERRL, conjunctiva/corneas clear, EOM's intact, both eyes   Ears:    Normal TM's and external ear canals, both ears. Minimal cerumen.    Nose:   Nares  normal, septum midline, mucosa normal, no drainage     or sinus tenderness   Throat:   Lips, mucosa, and tongue normal; teeth and gums normal   Neck:   Supple, symmetrical, trachea midline, no adenopathy;     thyroid:  no enlargement/tenderness/nodules; no JVD   Back:     Symmetric, no curvature, ROM normal, no CVA tenderness   Lungs:     Clear to auscultation bilaterally, respirations unlabored   Chest Wall:    No tenderness or deformity    Heart:    Regular rate and rhythm, S1 and S2 normal, no murmur, rub    or gallop   Breast Exam:    Deferred.    Abdomen:     Soft, non-tender, bowel sounds active all four quadrants,     no masses, no organomegaly   Genitalia:    Deferred.    Rectal:    Deferred.    Extremities:   Extremities normal, atraumatic, no cyanosis. Non pitting edema.   Pulses:   2+ and symmetric all extremities   Skin:   Skin color, texture, turgor normal, no rashes or lesions. ON right ring finger at PIP joint is a flesh colored excoriated area with black center.   Lymph nodes:   Cervical, supraclavicular, and axillary nodes normal   Neurologic:   Normal strength, sensation and reflexes     throughout         ASSESSMENT AND PLAN  Danielle Frye is a 60 y.o. female who returns for follow up for or new evaluation of:    1. Health care maintenance : Due for updated labs.    2. S/P colonoscopy : Repeat to be in 06/2018   3. Pulmonary nodules : Unchanged on CT, no further f/u indicated   4. Osteopenia : Based on DEXA she cont to have osteopneia- Encouraged weight bearing exercise and vitamin D supplements. Recheck DEXA in a two years.    5. Dizziness : Sx consistent with BPPV which may have been triggered by URI vs viral labyrinthitis- vital signs and cardiac exam are normal.     6. Reflux esophagitis : Controlled on zantac prn.           Medications/Orders:  Orders Placed This Encounter    AST    Basic metabolic panel    ALT    Lipid add Rfx to Drt LDL if Trig >400    CBC    Vitamin D  Danielle Frye should return to see me in one year.       Attestations:  Juanetta Beets am scribing for and in the presence of Dr. Verl Dicker        I, Roxy Manns, MD, personally performed the services described in this documentation, as scribed by the scribe listed above in my presence, and it is accurate and complete.    Roxy Manns, MD    06/16/16 2:25 PM

## 2016-06-16 NOTE — Patient Instructions (Signed)
Please call Dr. Dub Mikes' office for an appointment for ? Foreign body vs reaction on your finger.

## 2016-09-15 ENCOUNTER — Other Ambulatory Visit: Payer: Self-pay | Admitting: Gastroenterology

## 2017-03-16 IMAGING — OT DXA BONE DENSITY
2 series · 4 of 4 positions shown · non-contrast
Comparison: none

REASON FOR EXAM: Postmenopausal screening

RISK FACTORS:  None.
PRIOR EXAMS:  None
METHOD:  Scans of the spine and hip were performed using dual energy X-ray densitometry (DXA) with the Hologic Discovery C (S/ETEUD4) system.

[Series 4: — · 2 of 2 slices shown (1 of 2)]
[im 1/2]
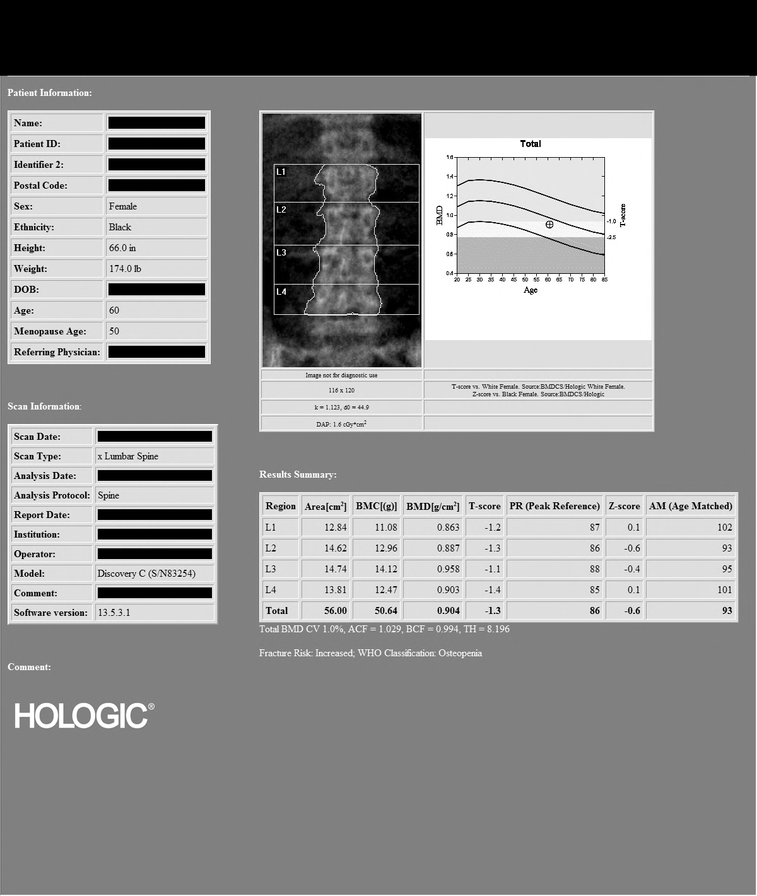
[im 2/2]
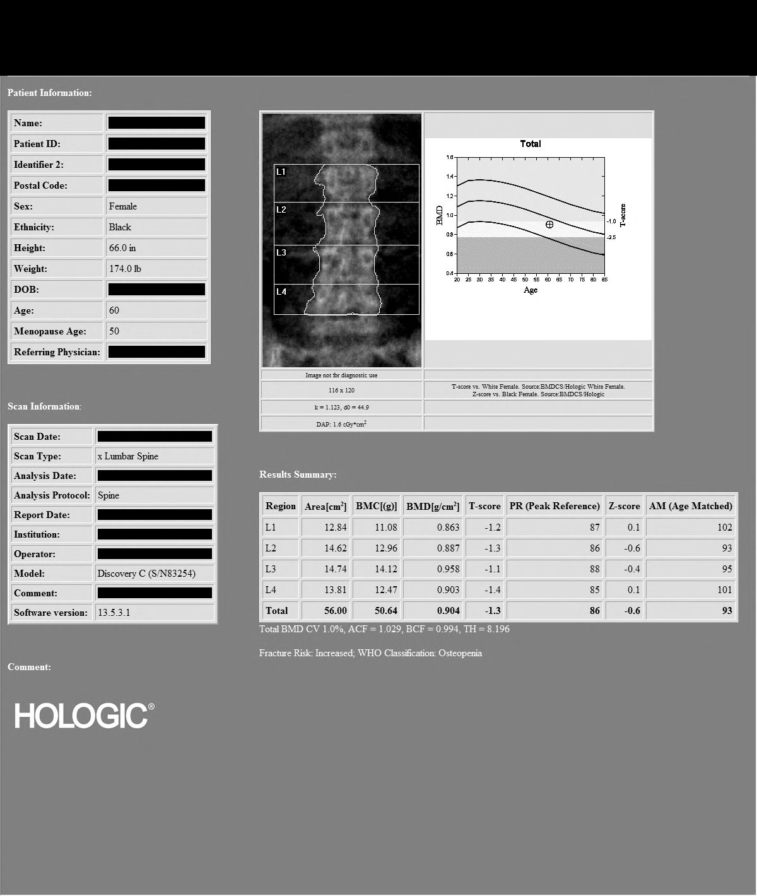

[Series 5: — · left · 2 of 2 slices shown (2 of 2)]
[im 1/2]
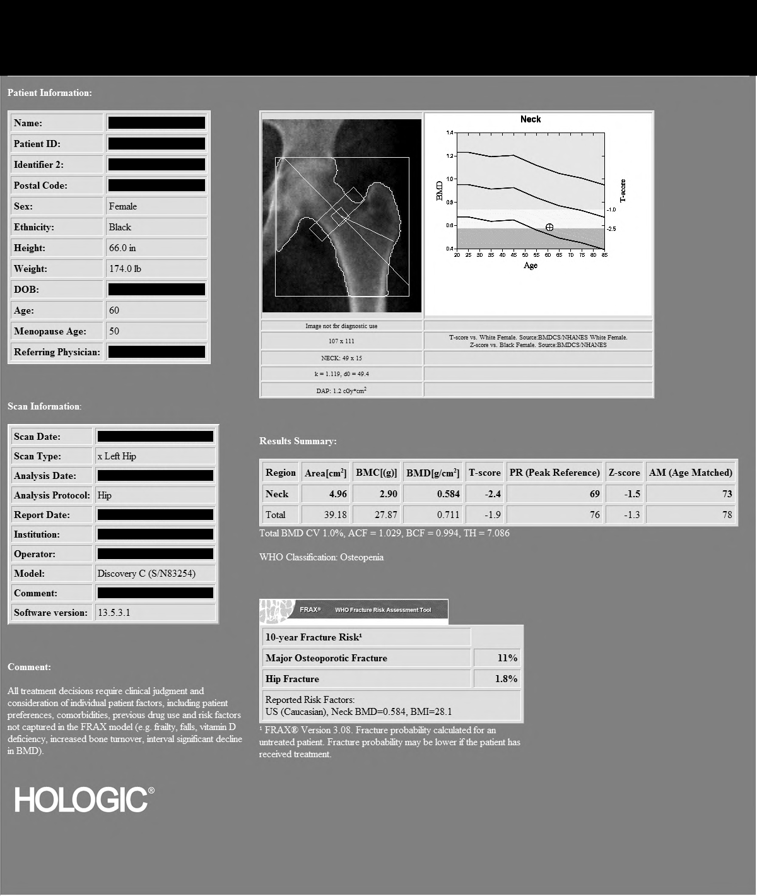
[im 2/2]
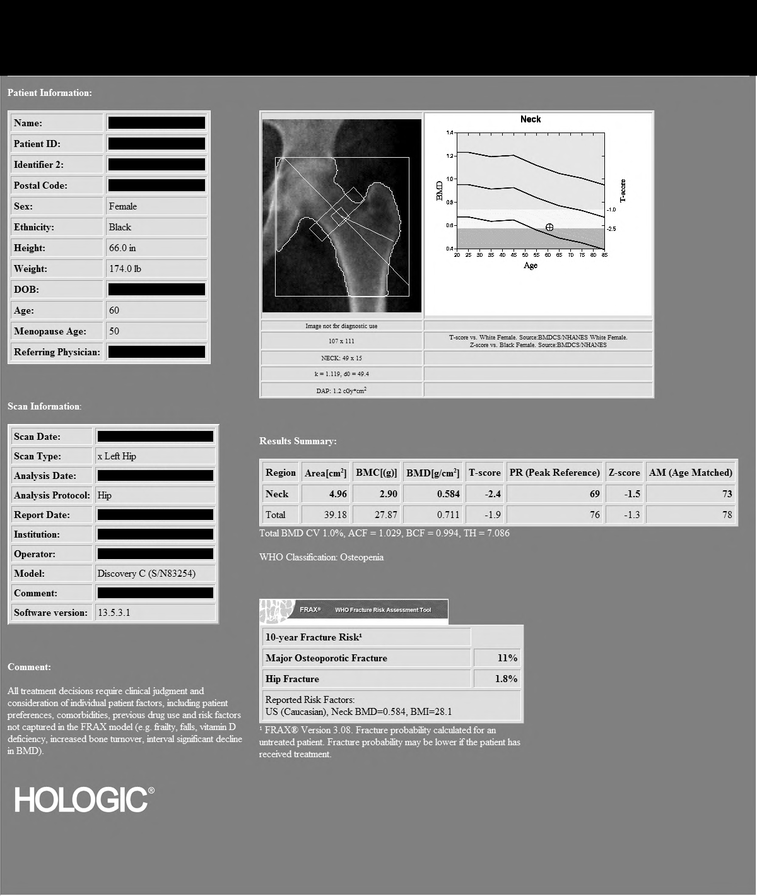

[4 of 4 positions shown; findings below may reference images not displayed]

IMPRESSION: As defined by World Health Organization, the patient meets the criteria for OSTEOPENIA based on both spine and hip T-scores.

PATIENT DEMOGRAPHICS:  58 year old Black female.
FINDINGS: 1.    Review of scanogram images shows endplate sclerosis that falsely elevate spine results. No vertebra is excluded from analysis. 

2.    The lumbar spine exam using L1-L4 regions shows average Bone Mineral Density is 0.904 gm/cm2 of Hydroxyapatite.  The T-score (comparing patient with a young adult group) is 1.3 standard deviations BELOW mean. The Z-score (comparing patient with an age-matched group) is 0.6 standard deviations BELOW mean.

3.  The left hip exam using femoral neck region of interest shows average Bone Mineral Density is 0.584 gm/cm2 of Hydroxyapatite. The T-score (comparing patient with a young adult group) is 2.4 standard deviations BELOW mean. The Z-score (comparing patient with an age-matched group) is 1.5 standard deviations BELOW mean.

According to the 0003 World Health Organization risk assessment tool (FRAX) for osteopenia only, which uses the femoral neck T score and includes other patient risk factors for fracture, the patient has a 10-year absolute risk of hip fracture of 1.8% and 10-year absolute risk fracture for any major fracture of 11% .

RECOMMENDATIONS:  The patient states that she is taking supplements on a regular basis.  The patient should continue being a nonsmoker and regular exercise to patient tolerance would be of benefit.  The patient is currently not taking prescribed medication for prevention of bone loss.  According to new criteria established by The National Osteoporosis Foundation in 0003, THE PATIENT DOES NOT MEET THE CURRENT INDICATIONS FOR PRESCRIBED MEDICAL THERAPY.  The National Osteoporosis Foundation now recommends followup DXA scanning every two years in patients at risk regardless of whether the patient is undergoing pharmacological treatment.

## 2017-04-06 ENCOUNTER — Telehealth: Payer: Self-pay | Admitting: Primary Care

## 2017-04-06 NOTE — Telephone Encounter (Signed)
Patient is ok to get blood work then

## 2017-04-06 NOTE — Telephone Encounter (Signed)
Patient calling to get her blood work date moved to 08/08/17. Patient rescheduled her annual physical to 08/08/17.  Patient can be reached at [phone] to confirm lab order extended.

## 2017-06-19 ENCOUNTER — Encounter: Payer: Self-pay | Admitting: Primary Care

## 2017-08-04 ENCOUNTER — Telehealth: Payer: Self-pay | Admitting: Primary Care

## 2017-08-04 DIAGNOSIS — Z0000 Encounter for general adult medical examination without abnormal findings: Secondary | ICD-10-CM

## 2017-08-04 NOTE — Telephone Encounter (Signed)
Needs order for blood work done and is requesting an order to be placed before going to her annual physical.    Patient is requesting a call back to notify [phone]

## 2017-08-07 NOTE — Telephone Encounter (Signed)
Called patient and left message to call the office back.    If patient calls back please ask if she would like to reschedule her Physical with Dr.Clark. At that point [facility] can put labs in for patient.

## 2017-08-08 ENCOUNTER — Encounter: Payer: Self-pay | Admitting: Primary Care

## 2017-08-08 NOTE — Telephone Encounter (Signed)
Lab orders are placed.  nsc

## 2017-08-08 NOTE — Telephone Encounter (Signed)
Patient called in regards to message below   Patient states that she will reschedule her annual physical when everything slows down at work     Patient is requesting to have her expired labs reordered in order to complete.   Patient requested to have the expiration on the labs to be for a year     Patient requested this in order to schedule a physical in the future and not have to worry about them expiring before her appointment   Patient can be reached at [phone] if needed

## 2017-08-09 NOTE — Telephone Encounter (Signed)
Called patient and left message explained Dr.Clark has placed standing orders for lab work.

## 2017-09-15 ENCOUNTER — Other Ambulatory Visit: Payer: Self-pay | Admitting: Gastroenterology

## 2017-12-19 ENCOUNTER — Encounter: Payer: Self-pay | Admitting: Gastroenterology

## 2018-01-08 ENCOUNTER — Telehealth: Payer: Self-pay | Admitting: Primary Care

## 2018-01-08 DIAGNOSIS — Z780 Asymptomatic menopausal state: Secondary | ICD-10-CM

## 2018-01-08 NOTE — Telephone Encounter (Signed)
Patient is requesting a referral be placed for a Dexa scan     Patient stated she will like to complete the Dexa scan at [place] location    Patient stated she needs the Dexa scan scheduled after 05/05/18    Patient can be reached at [phone] if necessary

## 2018-01-18 NOTE — Telephone Encounter (Signed)
Order placed  nsc

## 2018-01-19 ENCOUNTER — Telehealth: Payer: Self-pay | Admitting: Primary Care

## 2018-01-19 NOTE — Telephone Encounter (Signed)
Call to Laurena Bering to give appointment scheduled for a Dexa Scan as requested at last office visit.     Danielle Frye is scheduled on March 28th at 3:30 p.m.    Prep for this exam :none     This test is located at [facility], [place], [address].     Should patient need to reschedule, they can call [phone].    Call Outcome: Called, no answer, left message on voice mail and sent letter.  Will call again this afternoon.

## 2018-01-25 ENCOUNTER — Telehealth: Payer: Self-pay | Admitting: Primary Care

## 2018-01-25 NOTE — Telephone Encounter (Signed)
Anderson Malta, [facility] calling to advise the patient would like to schedule her Dexa Scan order through [facility].  Please fax the order and referral [phone].  Anderson Malta states [facility] can be reached to schedule [phone], patient needs to schedule Dexa Scan date that is sometime after 05/06/18.

## 2018-01-25 NOTE — Telephone Encounter (Signed)
[name] is requesting a return call [phone] advise her referral/order has been sent to [place] so she can schedule.

## 2018-02-02 NOTE — Telephone Encounter (Signed)
02/02/18 - Faxed Dexa Scan order over to [facility].  Called and notified Jazlen and gave her the number to [place] regarding scheduling her appointment with them.    JOdum

## 2018-02-15 ENCOUNTER — Other Ambulatory Visit: Payer: Self-pay

## 2018-05-09 ENCOUNTER — Other Ambulatory Visit: Payer: Self-pay | Admitting: Gastroenterology

## 2018-07-24 ENCOUNTER — Encounter: Payer: Self-pay | Admitting: Primary Care

## 2018-09-17 ENCOUNTER — Other Ambulatory Visit: Payer: Self-pay | Admitting: Gastroenterology

## 2018-10-01 ENCOUNTER — Other Ambulatory Visit
Admission: RE | Admit: 2018-10-01 | Discharge: 2018-10-01 | Disposition: A | Discharge status: Home / Self Care | Payer: PRIVATE HEALTH INSURANCE | Source: Ambulatory Visit | Attending: Anatomic Pathology & Clinical Pathology | Admitting: Anatomic Pathology & Clinical Pathology

## 2018-10-01 DIAGNOSIS — Z86010 Personal history of colonic polyps: Secondary | ICD-10-CM | POA: Insufficient documentation

## 2018-10-08 LAB — SURGICAL PATHOLOGY

## 2019-01-04 ENCOUNTER — Telehealth: Payer: Self-pay | Admitting: Primary Care

## 2019-01-04 ENCOUNTER — Encounter: Payer: Self-pay | Admitting: Internal Medicine

## 2019-01-04 ENCOUNTER — Ambulatory Visit: Payer: BLUE CROSS/BLUE SHIELD | Attending: Internal Medicine | Admitting: Internal Medicine

## 2019-01-04 VITALS — BP 136/80 | HR 84 | Temp 98.4°F | Ht 63.5 in | Wt 158.0 lb

## 2019-01-04 DIAGNOSIS — B353 Tinea pedis: Secondary | ICD-10-CM

## 2019-01-04 NOTE — Progress Notes (Signed)
Reason For Visit: The encounter diagnosis was Tinea pedis of right foot.      HPI:  Danielle Frye is 63 y.o. year old female who  has no past medical history on file.    Presents today reporting that she has lymphedema in her right leg.  She states she was diagnosed with lymphedema in her right leg almost 40 years ago after 5 surgery (2 knee surgeries and 3 c-sections).  She states after her 3rd child she went to a vascular surgeon who told her she had lymphedema.      She went to [place] in January and she came home and has been having pain and redness on the bottom of her right foot.  She states that she was walking on the beach bare foot and the bottom of her foot caught a bare shell.  She came home on the 2nd of January.  The following week (wed/thurs) she noticed that the bottom of her foot was stinging under her toes.  She didn't really look at it.  Finally she ended up looking at it and under her toes she had a red line and two tiny blisters.  She didn't think much of it, but then a couple days later it started to itch.      So she went to the podiatrist [facility]).  She was told that she has interdigital fungus.  She also had discoloration and a lump on the plantar surface of her toes.    The podiatrist told her it was likely fungus and he recommended soaking the foot.    A week later she ended up calling and she was concerned about dry skin - he ended up putting her on cephalexin 500 mg BID x 12 days.  She took the first dose last Friday night as well as soaking her foot TID.    This morning she went to put on antibiotic cream and she noticed clear fluid coming out.    Her right leg is at it's baseline and has been swollen for 40 years.  This has not increased in swelling.    She has noticed and increase in swelling in her right foot.    Denies fevers, chills, pain (although is uncomfortable at times when she walks).      Medications:     Current Outpatient Medications   Medication Sig     cephalexin (KEFLEX) 500 MG capsule Take 500 mg by mouth 2 times daily    famotidine (PEPCID) 10 MG tablet Take 10 mg by mouth as needed    Calcium-Vitamin D-Vitamin K (VIACTIV) [id number] MG-UNT-MCG CHEW CHEW ONE TABLET BY MOUTH THREE TIMES DAILY    Cholecalciferol (VITAMIN D) 1000 UNITS capsule TAKE 2 CAPSULE DAILY     No current facility-administered medications for this visit.        Medication list reconciled this visit    Allergies:     Allergies   Allergen Reactions    Latex      Created by Conversion - Rash;     Sulfa Antibiotics      Created by Conversion - Hives;        Social history      Social History     Tobacco Use    Smoking status: Former Smoker     Types: Cigarettes     Start date: 05/17/1992    Smokeless tobacco: Never Used   Substance Use Topics    Alcohol use: Not on file  Review of Systems     Pertinent items are noted in HPI.     Physical Exam:     Physical Exam  Vitals signs reviewed.   Constitutional:       General: She is not in acute distress.     Appearance: She is not diaphoretic.   HENT:      Head: Normocephalic and atraumatic.   Eyes:      General: No scleral icterus.     Conjunctiva/sclera: Conjunctivae normal.   Pulmonary:      Effort: Pulmonary effort is normal.   Skin:     General: Skin is warm and dry.      Coloration: Skin is not pale.      Findings: No erythema or rash.   Neurological:      Mental Status: She is alert and oriented to person, place, and time.   Psychiatric:         Behavior: Behavior normal.                 Vitals:    01/04/19 1001   BP: 136/80   Pulse: 84   Temp: 36.9 C (98.4 F)   TempSrc: Temporal   Weight: 71.7 kg (158 lb)   Height: 1.613 m (5' 3.5")     Wt Readings from Last 3 Encounters:   01/04/19 71.7 kg (158 lb)   06/11/15 68 kg (150 lb)   06/04/15 69 kg (152 lb 3.2 oz)     BP Readings from Last 3 Encounters:   01/04/19 136/80   06/16/16 122/80   06/11/15 124/83       ASSESSMENT/PLAN:     Danielle Frye was seen today for foot injury/ interdigital  tinea pedis in between toes 2/3.  Dr. Carlis Abbott kindly joined in room for evaluation in plan.  Unclear what cluster of raised redness is on dorsal surface of foot is - ?vesicular vs. verruciformlesions.  ?dishydrotic eczema.  appointment set up for dermatology next week with assistance of referral coordinator.    Treatment as outlined in patient instructions.    Diagnoses and all orders for this visit:    Tinea pedis of right foot         Follow up: PRN    Mar Daring, NP  [facility]  Patient instructions      Patient Instructions   Keep the toes clean and dry - change your socks frequently and don't wear socks to bed.    Stop the antibiotic ointment.    If you get redness, hot skin, fevers, chills, please call over the weekend.    For the bumps on your toes we will have you see dermatology.

## 2019-01-04 NOTE — Patient Instructions (Signed)
Keep the toes clean and dry - change your socks frequently and don't wear socks to bed.    Stop the antibiotic ointment.    If you get redness, hot skin, fevers, chills, please call over the weekend.    For the bumps on your toes we will have you see dermatology.

## 2019-01-04 NOTE — Telephone Encounter (Signed)
Writer spoke with pt who stated that she has had several surgeries  In the past and she now has lymphedema in her right leg.   Pt stated that she went to [place] in January and she came home and has been having pain and redness on the bottom of her right foot.   Pt went to the podiatrist and was put on cephalexin 500 mg BID.   Tonight will be 1 week of taking the antibiotic and now her leg is red, warm and weeping.   Writer scheduled pt to come in today at 10:10 to see Joellyn Quails, NP and advised her to call if her symptoms worsen.   Pt agreed to plan.

## 2019-01-04 NOTE — Telephone Encounter (Signed)
Copied from [place]. Topic: Access to Care - Speak to Provider/Office Staff  >> Jan 04, 2019  8:22 AM Danielle Frye, Danielle Frye Patient wrote:  [name] called in regards to experiencing Lymphedema in her Right Lower Leg    Patient also mentioned that she is experiencing a pinkish hue discoloration around and under her toes    Patient was seen by podiatrist and was prescribed antibiotic for what was believed to be athletes foot    Patient stated that she is still experiencing swelling and weeping    Patient is currently scheduled to see Dr. Carlis Abbott on Monday    Patient can be reached at [phone]

## 2019-01-07 ENCOUNTER — Encounter: Payer: Self-pay | Admitting: Gastroenterology

## 2019-01-07 ENCOUNTER — Ambulatory Visit: Payer: PRIVATE HEALTH INSURANCE | Admitting: Primary Care

## 2019-07-23 ENCOUNTER — Ambulatory Visit: Payer: BLUE CROSS/BLUE SHIELD | Attending: Primary Care | Admitting: Primary Care

## 2019-07-23 ENCOUNTER — Encounter: Payer: Self-pay | Admitting: Primary Care

## 2019-07-23 VITALS — BP 120/74 | HR 80 | Temp 97.7°F | Wt 148.4 lb

## 2019-07-23 DIAGNOSIS — Z0000 Encounter for general adult medical examination without abnormal findings: Secondary | ICD-10-CM | POA: Insufficient documentation

## 2019-07-23 DIAGNOSIS — M8580 Other specified disorders of bone density and structure, unspecified site: Secondary | ICD-10-CM | POA: Insufficient documentation

## 2019-07-23 LAB — BASIC METABOLIC PANEL
Anion Gap: 11 (ref 7–16)
CO2: 29 mmol/L — ABNORMAL HIGH (ref 20–28)
Calcium: 10.5 mg/dL — ABNORMAL HIGH (ref 8.6–10.2)
Chloride: 100 mmol/L (ref 96–108)
Creatinine: 0.88 mg/dL (ref 0.51–0.95)
GFR,Black: 80 *
GFR,Caucasian: 69 *
Glucose: 90 mg/dL (ref 60–99)
Lab: 15 mg/dL (ref 6–20)
Potassium: 4.7 mmol/L (ref 3.3–5.1)
Sodium: 140 mmol/L (ref 133–145)

## 2019-07-23 LAB — LIPID PANEL
Chol/HDL Ratio: 2.8
Cholesterol: 215 mg/dL — AB
HDL: 78 mg/dL — ABNORMAL HIGH (ref 40–60)
LDL Calculated: 127 mg/dL
Non HDL Cholesterol: 137 mg/dL
Triglycerides: 51 mg/dL

## 2019-07-23 LAB — CBC
Hematocrit: 41 % (ref 34–45)
Hemoglobin: 13.4 g/dL (ref 11.2–15.7)
MCH: 31 pg/cell (ref 26–32)
MCHC: 33 g/dL (ref 32–36)
MCV: 94 fL (ref 79–95)
Platelets: 216 10*3/uL (ref 160–370)
RBC: 4.4 MIL/uL (ref 3.9–5.2)
RDW: 12.3 % (ref 11.7–14.4)
WBC: 3 10*3/uL — ABNORMAL LOW (ref 4.0–10.0)

## 2019-07-23 LAB — VITAMIN D: 25-OH Vit Total: 58 ng/mL (ref 30–60)

## 2019-07-23 MED ORDER — PNEUMOCOCCAL VAC POLYVALENT 25 MCG/0.5ML INJ *WRAPPED*
0.5000 mL | INJECTION | Freq: Once | INTRAMUSCULAR | 0 refills | Status: AC
Start: 2019-07-23 — End: 2019-07-23

## 2019-07-23 MED ORDER — TETANUS-DIPHTH-ACELL PERT 5-2.5-18.5 LF-MCG/0.5 IM SUSP *WRAPPED*
0.5000 mL | Freq: Once | INTRAMUSCULAR | 0 refills | Status: AC
Start: 2019-07-23 — End: 2019-07-23

## 2019-07-23 NOTE — Patient Instructions (Addendum)
I would recommend getting your pneumonia vaccine and flu shot this fall.  You can get the tetanus shot in the spring.    Fasting blood work at [facility]- drink plenty of water

## 2019-07-23 NOTE — Progress Notes (Signed)
Internal Medicine Progress Note     No chief complaint on file.      HPI:   Pt here for CPE.  Healthwise she has done well.  She works at [employer].    Due for tetanus - new Rx sent to pharmacy.  Recommended flu shot.  Every once in a while dry cough or throat clearing.   Some ongoing urge incontinence.  Knees occasionally feel tight- walking 2-6 miles per day.  Rash on foot cleared up completely.  Colonoscopy and mammogram up to date, DEXA scan due next year        Patient's medications, allergies, past medical, surgical, social and family histories were reviewed, updated     Allergies:   Allergies as of 07/23/2019 - Up to Date 01/04/2019   Allergen Reaction Noted    Latex  10/30/2006    Sulfa antibiotics  10/30/2006        Home Meds:   Prior to Admission medications    Medication Sig Start Date End Date Taking? Authorizing Provider   cephalexin (KEFLEX) 500 MG capsule Take 500 mg by mouth 2 times daily    [provider]   famotidine (PEPCID) 10 MG tablet Take 10 mg by mouth as needed    [provider]   Calcium-Vitamin D-Vitamin K (VIACTIV) [id number] MG-UNT-MCG CHEW CHEW ONE TABLET BY MOUTH THREE TIMES DAILY 03/26/10   Provider, Conversion   Cholecalciferol (VITAMIN D) 1000 UNITS capsule TAKE 2 CAPSULE DAILY 03/26/10   Provider, Conversion        Physical Exam:   Last set of vitals:  Vitals:    07/23/19 0938   BP: 120/74   Pulse: 80   Temp: 36.5 C (97.7 F)   Weight: 67.3 kg (148 lb 6.4 oz)       General: NAD  HEENT: PERRLA, EOMI, anicteric sclera, oropharynx with moist mm, neck supple, carotids 2+ bilat  Cardiac: RR no m/r/g.  Lungs: CTAB  Abd: S/ND/NT +BS  Ext: non pitting edema bilaterally with significant venous varicosity        Assessment/Plan: Danielle Frye is here today for CPE.  SHe is doing well- newly married.     1. Health care maintenance : will update labs, due for flu shot, pneumovax and then can have Tdap, declines shingrix.   2. Osteopenia, unspecified location : check vit D              Medications/Orders:  Orders Placed This Encounter    Basic metabolic panel    Lipid Panel (Reflex to Direct  LDL if Triglycerides more than 400)    CBC    Vitamin D    Tdap, BOOSTRIX,: tetanus, diphtheria, and accellar pertussis injection    pneumococcal vaccine (PNEUMOVAX 23) 25 MCG/0.5ML injection       RTO: one year and prn.    Roxy Manns, MD on 07/23/2019 at 9:45 AM

## 2019-08-06 ENCOUNTER — Encounter: Payer: Self-pay | Admitting: Primary Care

## 2019-08-28 ENCOUNTER — Telehealth: Payer: Self-pay | Admitting: Primary Care

## 2019-08-28 NOTE — Telephone Encounter (Signed)
Returned call to patient per message back from Dr Carlis Abbott and informed her that Dr Carlis Abbott is extremely sorry to hear about this.  Advised that Dr Carlis Abbott thinks Dr Lamar Benes will be just great    Patient stated appreciation for message

## 2019-08-28 NOTE — Telephone Encounter (Signed)
Dr Carlis Abbott please see patient request below and advise    Thank you!

## 2019-08-28 NOTE — Telephone Encounter (Signed)
Copied from [place] [phone]. Topic: Return Call - Speak to Provider/Office Staff  >> Aug 28, 2019 12:28 PM Lula Olszewski wrote:  Danielle Frye would like to speak to [facility] only regarding her 66 year old grandson. He had surgery, the final diagnosis was rhabdomyo sarcoma and he has an appointment tomorrow at oncology with Colin Benton at 11:00am. She would like to know what Dr.Clark's advice is on Dr.Bruckner. Please call Danielle Frye at [phone]

## 2019-08-29 ENCOUNTER — Other Ambulatory Visit: Payer: Self-pay | Admitting: Primary Care

## 2019-09-07 ENCOUNTER — Encounter: Payer: Self-pay | Admitting: Primary Care

## 2019-09-17 ENCOUNTER — Other Ambulatory Visit
Admission: RE | Admit: 2019-09-17 | Discharge: 2019-09-17 | Disposition: A | Discharge status: Home / Self Care | Payer: BLUE CROSS/BLUE SHIELD | Source: Ambulatory Visit | Attending: Primary Care | Admitting: Primary Care

## 2019-09-17 LAB — BASIC METABOLIC PANEL
Anion Gap: 9 (ref 7–16)
CO2: 29 mmol/L — ABNORMAL HIGH (ref 20–28)
Calcium: 9.6 mg/dL (ref 8.6–10.2)
Chloride: 102 mmol/L (ref 96–108)
Creatinine: 0.85 mg/dL (ref 0.51–0.95)
GFR,Black: 83 *
GFR,Caucasian: 72 *
Glucose: 96 mg/dL (ref 60–99)
Lab: 10 mg/dL (ref 6–20)
Potassium: 4.8 mmol/L (ref 3.3–5.1)
Sodium: 140 mmol/L (ref 133–145)

## 2019-09-18 ENCOUNTER — Other Ambulatory Visit: Payer: Self-pay | Admitting: Gastroenterology

## 2020-02-12 ENCOUNTER — Other Ambulatory Visit: Payer: Self-pay | Admitting: Pulmonary Disease

## 2020-02-12 DIAGNOSIS — Z23 Encounter for immunization: Secondary | ICD-10-CM

## 2020-03-04 ENCOUNTER — Other Ambulatory Visit
Admission: RE | Admit: 2020-03-04 | Discharge: 2020-03-04 | Disposition: A | Discharge status: Home / Self Care | Payer: BLUE CROSS/BLUE SHIELD | Source: Ambulatory Visit | Attending: Obstetrics | Admitting: Obstetrics

## 2020-03-04 DIAGNOSIS — Z1151 Encounter for screening for human papillomavirus (HPV): Secondary | ICD-10-CM | POA: Insufficient documentation

## 2020-03-04 DIAGNOSIS — Z124 Encounter for screening for malignant neoplasm of cervix: Secondary | ICD-10-CM | POA: Insufficient documentation

## 2020-03-10 LAB — GYN CYTOLOGY

## 2020-07-28 ENCOUNTER — Encounter: Payer: BLUE CROSS/BLUE SHIELD | Admitting: Primary Care

## 2020-08-04 ENCOUNTER — Encounter: Payer: Self-pay | Admitting: Primary Care

## 2020-08-04 ENCOUNTER — Ambulatory Visit: Payer: BLUE CROSS/BLUE SHIELD | Admitting: Primary Care

## 2020-08-04 VITALS — BP 140/90 | HR 82 | Temp 97.7°F | Wt 160.9 lb

## 2020-08-04 DIAGNOSIS — M25531 Pain in right wrist: Secondary | ICD-10-CM

## 2020-08-04 DIAGNOSIS — M25511 Pain in right shoulder: Secondary | ICD-10-CM

## 2020-08-04 DIAGNOSIS — Z86010 Personal history of colonic polyps: Secondary | ICD-10-CM

## 2020-08-04 DIAGNOSIS — M25532 Pain in left wrist: Secondary | ICD-10-CM

## 2020-08-04 DIAGNOSIS — M8580 Other specified disorders of bone density and structure, unspecified site: Secondary | ICD-10-CM

## 2020-08-04 DIAGNOSIS — Z0000 Encounter for general adult medical examination without abnormal findings: Secondary | ICD-10-CM

## 2020-08-04 MED ORDER — PNEUMOCOCCAL VAC POLYVALENT 25 MCG/0.5ML INJ *WRAPPED*
0.5000 mL | INJECTION | Freq: Once | INTRAMUSCULAR | 0 refills | Status: AC
Start: 2020-08-04 — End: 2020-08-04
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
## 2020-08-04 NOTE — Patient Instructions (Addendum)
Please call Dr. Lavona Mound office to check on call back date for colonoscopy.  Last colonoscopy 10/01/2018.    You are due for both pneumonia vaccine and flu vaccine- you can get them together but if you prefer can stagger by one month    Can call [phone], Dr. Theodoro Doing for wrist pain/ hand pain appt.  Also appt with Dr. Carlisle Beers for your shoulder.

## 2020-08-21 ENCOUNTER — Other Ambulatory Visit: Payer: Self-pay | Admitting: Gastroenterology

## 2020-09-04 ENCOUNTER — Telehealth: Payer: Self-pay | Admitting: Primary Care

## 2020-09-04 NOTE — Telephone Encounter (Signed)
Call to Danielle Frye to give appointment scheduled for a DEXA scan as requested at last office visit.     Danielle Frye is scheduled on November 29th at 10:30 am    Prep for this exam : None    This test is located at [place] [address]    Should patient need to reschedule, they can call [phone]    Call Outcome: LVM. Sent Letter.

## 2020-09-11 ENCOUNTER — Other Ambulatory Visit: Payer: Self-pay | Admitting: Gastroenterology

## 2020-10-14 ENCOUNTER — Other Ambulatory Visit: Payer: Self-pay | Admitting: Orthopedic Surgery

## 2020-10-14 DIAGNOSIS — G8929 Other chronic pain: Secondary | ICD-10-CM

## 2020-10-14 DIAGNOSIS — M25511 Pain in right shoulder: Secondary | ICD-10-CM

## 2020-10-19 ENCOUNTER — Other Ambulatory Visit: Payer: BLUE CROSS/BLUE SHIELD

## 2020-10-20 ENCOUNTER — Ambulatory Visit: Payer: BLUE CROSS/BLUE SHIELD | Attending: Orthopedic Surgery | Admitting: Sports Medicine

## 2020-10-20 ENCOUNTER — Encounter: Payer: Self-pay | Admitting: Sports Medicine

## 2020-10-20 ENCOUNTER — Ambulatory Visit
Admission: RE | Admit: 2020-10-20 | Discharge: 2020-10-20 | Disposition: A | Discharge status: Home / Self Care | Payer: BLUE CROSS/BLUE SHIELD | Source: Ambulatory Visit

## 2020-10-20 VITALS — BP 160/80 | HR 91 | Ht 63.5 in | Wt 160.0 lb

## 2020-10-20 DIAGNOSIS — G8929 Other chronic pain: Secondary | ICD-10-CM

## 2020-10-20 DIAGNOSIS — M25511 Pain in right shoulder: Secondary | ICD-10-CM

## 2020-10-20 DIAGNOSIS — M7541 Impingement syndrome of right shoulder: Secondary | ICD-10-CM | POA: Insufficient documentation

## 2020-10-20 DIAGNOSIS — M19011 Primary osteoarthritis, right shoulder: Secondary | ICD-10-CM

## 2020-10-20 DIAGNOSIS — M542 Cervicalgia: Secondary | ICD-10-CM | POA: Insufficient documentation

## 2020-10-20 MED ORDER — BETAMETHASONE ACET & SOD PHOS 6 (3-3) MG/ML IJ SUSP *I*
12.0000 mg | Freq: Once | INTRAMUSCULAR | Status: AC | PRN
Start: 2020-10-20 — End: 2020-10-20
  Administered 2020-10-20: 12 mg via INTRA_ARTICULAR

## 2020-10-20 MED ORDER — LIDOCAINE HCL 1 % IJ SOLN *I*
5.0000 mL | Freq: Once | INTRAMUSCULAR | Status: AC | PRN
Start: 2020-10-20 — End: 2020-10-20
  Administered 2020-10-20: 5 mL via INTRA_ARTICULAR

## 2020-10-20 NOTE — Procedures (Signed)
Large Joint Aspiration/Injection Procedure: R subacromial bursa    Date/Time: 10/20/2020  9:00 AM EST  Consent given by: patient  Site marked: site marked  Timeout: Immediately prior to procedure a time out was called to verify the correct patient, procedure, equipment, support staff and site/side marked as required     Procedure Details    Location: shoulder - R subacromial bursa  Preparation: The site was prepped using the usual aseptic technique.  Ultrasound guidance:  Ultrasound was utilized to improve needle visualization, injection accuracy, and anatomic localization.    Anesthetics administered: 5 mL lidocaine HCL 1 %  Intra-Articular Steroids administered: 12 mg betamethasone acetate-betamethasone sodium phosphate 6 (3-3) mg/mL  Dressing:  A dry, sterile dressing was applied.  Patient tolerance: patient tolerated the procedure well with no immediate complications

## 2020-10-20 NOTE — Procedures (Signed)
Procedure :  Diagnostic ultrasound right shoulder.    Examiner :  [place] Caul, MD    Equipment : [place] Code : [phone]    ICD- 9/10 Code :     ICD-10-CM ICD-9-CM   1. Chronic right shoulder pain  M25.511 719.41    G89.29 338.29   2. Subacromial impingement, right  M75.41 726.19   3. Neck pain on right side  M54.2 723.1   , M25.511  rt shoulder pain    Indication:  Based on history and physical exam the patient may have significant injury to the rotator cuff. The plan is to evaluate tendon and ligament integrity in the shoulder to guide further treatment options. Ultrasound was utilized as a superior imaging modality to provide real time diagnostic feedback with potential to elicit dynamic changes in pertinent anatomy, and within Medicare guidelines.    Technique: With the patient in the upright position multiple static and dynamic images were obtained of the right shoulder using the linear transducer.  Appropriate images were saved and archived.     Findings : Short axis view of the biceps shows  tendinopathy of the tendon with fluid in the sheath and negative subluxation with dynamic testing. Short  and long axis view of the subscapularis shows tendinopathy of the tendon. Short and long axis views of the supraspinatus shows partial tear of the tendon. With dynamic resistance testing there is no retraction of the tendon. Short and long axis views of the infraspinatus shows tendinopathy of the tendon. With dynamic resistance testing there is no retraction of the tendon. There is no joint effusion. Evaluation of the posterior labrum shows degenerative changes.    Impression : Diagnostic ultrasound of the right shoulder with findings supporting subacromial impingement syndrome as the leading contribution to pain and dysfunction.    Procedures

## 2020-10-20 NOTE — Progress Notes (Signed)
History:  Danielle Frye is a 64 y.o. that is being seen as a new patient for evaluation of her Right shoulder pain.  Patient reports that she has been experiencing R shoulder pain on and off for at least the past 20 years, but that it has progressively worsened since this summer. She denies any acute injury. The pain wakes her up from night and her decreased ROM makes it difficult to perform some of her ADLs. She has tried PT which has provided some relief as well as NSAIDs which provide temporary relief  The patient is left UE dominant.  Patient had an independent historian present for visit. no  Past medical history, past surgical history, medications, allergies, family history, social history, and review of systems were reviewed today and have been documented separately in this encounter.      Physical Examination:  She is in no acute distress.  She is alert and oriented x 3.   Examination of her neck demonstrates limited cervical range of motion   Skin appearance is normal with no evidence of rash or other lesions.  Palpable radial pulse.  Range of Motion: AROM  The right shoulder demonstrates active forward elevation to 160 degrees. She externally rotates to 60 degrees. She internally rotates to L3.   Strength:  Forward Elevation MMT 4/5  External rotation MMT 4/5  Internal rotation MMT 4/5    Special tests:  Hawkins test was negative.    Neer test was positive.    O'Brian test was negative.     Jobe's test was negative  Palpation:  There is not tenderness at the [facility] joint. There is not tenderness along the biceps tendon. Distally she is neurovascularly intact. There is not scapular dyskinesia.  There is not scapular winging.  Imaging: I personally reviewed the patients images.X-Ray:images viewed today were AP, Lateral and Axillary views of the R shoulder demonstrate mild to moderate degenerative changes of the [employer] and GH joint.  Assessment:  Right shoulder subacromial impingment  Plan: I discussed the diagnosis  and the treatment options with the patient.  We discussed that some of her pain may be originating from her cervical spine and provided her with a referral to PMR for further evaluation. Diagnostic US was performed to dynamically assess integrity of the Rotator Cuff and biceps tendon in the right shoulder.  The Rotator Cuff was examined and demonstrates partial RC tear. Patient was offered cortisone injection to decrease inflammation.  Risks and benefits of injection were discussed.  Patient wanted to proceed with injection. The patient was given a referral to start physical therapy  We will see her  back in 6 weeks if her symptoms fail to improve.    Tillman Abide MD PhD  Orthopaedic surgery      I saw and evaluated the patient.  I agree with the resident's/fellows findings and plan of care as documented above.    Cromwell Caul, MD  Professor of Orthopaedics  Director: Shoulder and Elbow Division  Team Physician: [facility]    [facility]  [place], [place] [address]  Office tel: [phone]  Office fax: [phone]  Appointments tel: [phone]

## 2020-10-28 ENCOUNTER — Ambulatory Visit: Payer: BLUE CROSS/BLUE SHIELD | Attending: Obstetrics and Gynecology | Admitting: Physical Therapy

## 2020-10-28 DIAGNOSIS — M25511 Pain in right shoulder: Secondary | ICD-10-CM | POA: Insufficient documentation

## 2020-10-28 DIAGNOSIS — G8929 Other chronic pain: Secondary | ICD-10-CM | POA: Insufficient documentation

## 2020-10-28 NOTE — Progress Notes (Signed)
Physical Therapy Daily Flowsheet:  *Please see Physical Therapy Exercise Flowsheet for details regarding exercises completed this session.*     10/28/20 0800   Overview   Diagnosis Right shoulder pain   Insurance [employer]   Script Date 10/20/20   Visit # 1   Additional Comments Initial eval: see attached   Pain Assessment    0-10 Scale 0   Functional Outcome Measures   Disability of the Arm, Shoulder, and Hand Yes   Open a tight or new jar 2   Write 1   Turn a key 1   Prepare a meal 1   Push open a heavy door 2   Place an object on a shelf above your head 3   Do heavy household chores Luling or do yard work 1   Make a bed 1   Carry a shopping bag or briefcase 1   Carry a heavy object (over 10 lbs) 1   Change a lightbulb overhead 2   Wash or blow dry your hair 3   Wash your back 2   Put on a pullover sweater 2   Use a knife to cut food 1   Recreational activities which require little effort 1   Recreational activities in which you take some force or impact through your arm, shoulder or hand 3   Recreational activities in which you move your arm freely 2   Manage transportation needs 1   Sexual activities 2   During the past week, social activities 1   During the past week, work or other regular daily activities 1   Arm, Shoulder or Hand pain 1   Arm, Shoulder or hand pain when you performed any specific activity 2   Tingling in your arm, shoulder or hand 2   Weakness in your arm, shoulder, or hand 3   Stiffness in your arm, shoulder, or hand 3   During the past week, difficulty with sleeping 1   Less Capable, less Confident, less useful 4   Disability of Arm, Shoulder, Hand Score 20   Job/Work type Secretary   Using your usual technique for your work 1   Doing your usual work because of arm, shoulder or hand pain. 2   Doing your work as well as you would like? 2   Spending your usual amount of time doing your work? 2   DASH Work Module Score 18.75   Patient Education   Educated in disease process Yes    Educated in home exercise program Yes   Additional Patient Education HEP: Shoulder flexion, abduction, ER, extension with dowel, IR with strap    Time Calculation   PT Timed Codes 15   PT Untimed Codes 30   PT Total Treatment 45     Earnest Conroy, PT, DPT  Phone: [phone]  Fax: [phone]

## 2020-10-28 NOTE — Progress Notes (Signed)
Department of Physical Medicine & Rehabilitation  Physical Therapy Initial Assessment      HISTORY:  Diagnosis: Right shoulder pain     History per referring provider 10/20/20: Danielle Frye is a 64 y.o. that is being seen as a new patient for evaluation of her Right shoulder pain.  Patient reports that she has been experiencing R shoulder pain on and off for at least the past 20 years, but that it has progressively worsened since this summer. She denies any acute injury. The pain wakes her up from night and her decreased ROM makes it difficult to perform some of her ADLs. She has tried PT which has provided some relief as well as NSAIDs which provide temporary relief  The patient is left UE dominant.    No past medical history on file.    Past Surgical History:   Procedure Laterality Date    HYSTERECTOMY      Cesarean Hysterectomy Conversion Data     KNEE SURGERY      Knee Surgery Conversion Data     TUBAL LIGATION      Tubal Ligation Conversion Data        Referring practitioner: Jacquelin Hawking, *  Onset date on symptoms/Date of Surgery:  10/20/20 - Occasional right shoulder pain for 20+ years, Fall on shoulder 4 years prior  Mechanism of injury: No specific cause  Diagnostic tests: Ultrasound, XRay  Previous Treatments: Prior PT May 2021, Chiropractic, massage therapy  Work Status: Works as a Network engineer    SUBJECTIVE:     Symptoms: Pt notes having occasional soreness in her shoulder and arm especially following strenous activities such as cleaning. Pt reports feeling as though she has lost ROM in her shoulder as well. Pt reports when having shoulder pain it occasionally radiates into her bicep and into her hand.   Symptom intensity (0 - 10 scale): Now 0 Best 0 Worst 0   Symptoms worsen with/Current functional limitations: Working for long periods, cleaning for long periods  Symptoms improve with: Rest    Patients goals for therapy: Reduce pain, Increase ROM / flexibility, Increase strength, Achieve  independence with home program for self care / condition management      OBJECTIVE:   Observation: Pleasant, cooperative female in NAD.   Palpation: No pain on palpation to shoulder musculature    AROM:  ROM Left Shoulder Right Shoulder   Flexion 170 155   External Rotation 80 40   Internal Rotation 80 60   Other ()     *Indicates pain    Strength:   Strength Left Shoulder Right Shoulder   Flexion 4+ 4-   External Rotation 4+ 4+   Internal Rotation   4+ 4-   Abduction 4+ 4+   Supraspinatus 4+ 4-   Other ()       Joint mobility: WNL    Special Tests:    Shoulder: Neer,  negative  Michel Bickers,  negative  ER lag sign,  negative  Drop arm test,  negative   Subscapularis lift off,  negative    Exercises: See flowsheet for details  Patient Education:  Patient Education  Educated in disease process: Yes  Educated in home exercise program: Yes  Additional Patient Education: HEP: Shoulder flexion, abduction, ER, extension with dowel, IR with strap      Functional outcome measures: DASH: 20% disability, Work: 19% disability    ASSESSMENT:  Danielle Frye is a 64 y.o. female who presents to physical therapy with  pain, decreased ROM, weakness, and postural deviations secondary to signs and symptoms consistent with Diagnosis: Right shoulder pain. Skilled physical therapy services indicated to increase function and to address goals below.     The following comorbidities may affect treatment/recovery: none noted and the following   Personal factors may affect treatment/recovery: none identified.      Clinical presentation: stable    Patient complexity is low level as indicated by above personal factors, environmental factors and comorbidities in addition to their impairments found on physical exam.     Rehab potential/prognosis: good  Patient's understanding: good     Short term goals: 4 weeks  Pain:  Patient will report reduction in symptoms greater than 25 % of the time.  HEP:  Patient will be independent with a basic home  exercise program.  Patient will increase all UE range of motion by 10 degrees.  Shoulder:   Patient will be able to dress without difficulty.  Functional Outcomes:   Patient's DASH score will be less than 10%.     Long term goals: 8 weeks  Pain:  Patient will report reduction in symptoms greater than 50% of the time.  Patient will be independent with a comprehensive exercise program.  ROM/Strength:   Patient will increase all UE strength to 4+/5.  Patient will increase all UE range of motion to WNL.  Patient will be able to wash hair without difficulty.  Patient will reach behind their back to put their coat/short on without increased pain.  Functional Outcomes:   Patient's DASH work score will be less than 10%.    PLAN:  Plan of care: Appropriate for PT  PT interventions: AROM/PROM/Therapeutic exercise, Closed chain activites, Cold, Flexibility, General conditioning, Heat, Home exercise program instruction, Manual therapy, Patient/Family Education, Postural training/body Dealer education, Strengthening  PT frequency:  Once a week  PT duration: 8 weeks            Thank you for the referral.  If you have any questions and/or concerns, please feel free to contact me at [phone].    Earnest Conroy, PT, DPT  Phone: [phone]  Fax: [phone]

## 2020-10-28 NOTE — Progress Notes (Signed)
Physical Therapy Exercise Flowsheet:  *Please refer to Physical Therapy Daily Flowsheet for further details of this session.*     10/28/20 0900   Shoulder Exercises   additional exercise Established HEP: Shoulder flexion, abduction, ER, extension with dowel, IR with strap    Total time 15 min therex     Earnest Conroy, PT, DPT  Phone: [phone]  Fax: [phone]

## 2020-11-06 ENCOUNTER — Ambulatory Visit: Payer: BLUE CROSS/BLUE SHIELD | Admitting: Physical Medicine and Rehabilitation

## 2020-11-06 ENCOUNTER — Encounter: Payer: Self-pay | Admitting: Physical Medicine and Rehabilitation

## 2020-11-06 VITALS — BP 142/78 | HR 91 | Ht 63.5 in | Wt 160.0 lb

## 2020-11-06 DIAGNOSIS — M542 Cervicalgia: Secondary | ICD-10-CM

## 2020-11-06 DIAGNOSIS — M5412 Radiculopathy, cervical region: Secondary | ICD-10-CM

## 2020-11-06 NOTE — Progress Notes (Signed)
ID: Danielle Frye is a 64 y.o. 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    She describes primary symptoms of tightness and discomfort in the right neck she is had chiropractic treatment.  She had physical therapy back in the spring 2021 was given exercises and had some modalities done at some point the neck pain actually got worse and she discontinued the therapy.  Symptoms were quite aggravated when she used exercise bands    She has taken anti-inflammatories to the grade she is able to tolerate.    She occasionally has pain that refers down towards the right shoulder but she does not have radiating pain down the arm.  She has no weakness.    Medical History:No past medical history on file.    Surgical History:  Past Surgical History:   Procedure Laterality Date    HYSTERECTOMY      Cesarean Hysterectomy Conversion Data     KNEE SURGERY      Knee Surgery Conversion Data     TUBAL LIGATION      Tubal Ligation Conversion Data              GGY:IRSWNI of Systems , 10 systems reviewed all normal.        Allergies:  Allergies   Allergen Reactions    Latex      Created by Conversion - Rash;     Sulfa Antibiotics      Created by Conversion - Hives;         Vitals:    11/06/20 0839   BP: 142/78   Pulse: 91   Weight: 72.6 kg (160 lb)   Height: 1.613 m (5' 3.5")     Body mass index is 27.9 kg/m.  Physical Exam:    Observations: Pleasant woman awake alert oriented well-developed well-nourished no acute distress.  Presentation:.  Timely fashion well-dressed well-groomed.  Affect:.  Normal bright and affable.  Gait and station:.  Stable nonantalgic.  Postures:.  Sitting posture is normal.    Testing:.  Informal neck range of  motion and range of motion appears normal.  There is no atrophy of either upper limb or shoulder girdle.  There is no winging of the scapula either at rest or upon provocative maneuvers.  There is no tenderness over the cervical paraspinals or midline.  There is no deformity of the cervical or upper thoracic spine to visual or palpatory inspection.  There is no tenderness over the supraspinatus or proximal biceps tendon.  Shoulder range of motion does not reproduce symptoms.  Spurling's test is negative    Range of motion:.  Cervical range of motion is grossly normal.  Is a shoulder range of motion.    Skin:.  There is no lymphadenopathy in the cervical, inguinal or axillary regions.  Skin is intact in all four limbs and in the trunk.  Tibial and radial artery pulses are intact bilaterally.  Lymph:Marland Kitchen  Pulses:.    Neuromuscular:Marland Kitchen    Motor Strength:.  Strength is normal in the shoulder abductors, elbow flexors, wrist extensors and finger intrinsics bilaterally.  In the lower limbs strength is normal in the hip flexors,  knee extensors, ankle dorsiflexors, and in the EHL bilaterally    Sensory Exam:.  Sensation is normal to light touch to all dermatomes in the upper and lower extremities bilaterally.    Reflexes:.  Reflexes are symmetric and normal at the biceps, triceps, brachioradialis, patellar tendon, and Achilles tendon bilaterally.  Toes are downgoing.  Romberg is negative.    Imaging:.  MRI of the cervical spine is pending    Other Diagnostics:.    Assessment:.  Chronic cervicalgia unresponsive to conservative management occluding physical therapy chiropractic and anti-inflammatories.  She does have some referred pain radiating pain possible cervical radiculitis.  Plan:.  We will obtain MRI of the cervical spine to see if there is anything more specific we can approach and to rule out any occult lesions

## 2020-11-09 ENCOUNTER — Encounter: Payer: Self-pay | Admitting: Physical Therapy

## 2020-11-09 ENCOUNTER — Ambulatory Visit: Payer: BLUE CROSS/BLUE SHIELD | Admitting: Physical Therapy

## 2020-11-09 DIAGNOSIS — M25511 Pain in right shoulder: Secondary | ICD-10-CM

## 2020-11-09 NOTE — Progress Notes (Signed)
Physical Therapy Daily Flowsheet:  *Please see Physical Therapy Exercise Flowsheet for details regarding exercises completed this session.*     11/09/20 0900   Overview   Diagnosis Right shoulder pain   Insurance [employer]   Script Date 10/20/20   Visit # 2   Additional Comments Pt notes her shoulder is continuing to feel slightly better. Pt continues to report difficulty while working which she attributes to her desk set up. Pt tolerated todays session well with minimal to no increase in pain or discomfort.    Pain Assessment    0-10 Scale 3   Modalities   Moist Heat Yes   Time 10   Settings sitting   Patient Education   Educated in disease process Yes   Educated in home exercise program Yes   Additional Patient Education HEP: Shoulder flexion, abduction, ER, extension with dowel, IR in sidelying, 6 way TB orange   Time Calculation   PT Timed Codes 30   PT Untimed Codes 0   PT Total Treatment 30     Earnest Conroy, PT, DPT  Phone: [phone]  Fax: [phone]

## 2020-11-09 NOTE — Progress Notes (Signed)
Physical Therapy Exercise Flowsheet:  *Please refer to Physical Therapy Daily Flowsheet for further details of this session.*     11/09/20 0900   Shoulder Exercises   Scapular Retraction, Theraband Comment TB rows- 2x10 purple TB- pt tolerated well    Shoulder Rows Comment UBE- 2 min forward/ 2 min back- L1.5- pt tolerated well   additional exercise Reviewed HEP: Shoulder flexion, abduction, ER, extension with dowel, IR with strap    additional exercise Sidelying shoulder IR- 3x10sec - added to HEP   additional exercise 6 way TB- 10x each- orange TB- pt tolerated well    Total time 30 min therex     Earnest Conroy, PT, DPT  Phone: [phone]  Fax: [phone]

## 2020-11-16 ENCOUNTER — Ambulatory Visit: Payer: BLUE CROSS/BLUE SHIELD | Admitting: Physical Therapy

## 2020-11-16 DIAGNOSIS — G8929 Other chronic pain: Secondary | ICD-10-CM

## 2020-11-16 NOTE — Progress Notes (Signed)
Physical Therapy Exercise Flowsheet:  *Please refer to Physical Therapy Daily Flowsheet for further details of this session.*     11/16/20 0900   Shoulder Exercises   Scapular Retraction, Theraband Comment TB rows- 2x10 purple TB- pt tolerated well   Shoulder Rows Comment UBE- 3 min forward/ 3 min back- L1.5- pt tolerated well   additional exercise Shoulder flexion with TB at wall- 2x10 pt tolerated well    additional exercise upper trap, levator scap stretch- 10 sec each    Total time 30 min therex      Earnest Conroy, PT, DPT  Phone: [phone]  Fax: [phone]

## 2020-11-16 NOTE — Progress Notes (Signed)
Physical Therapy Daily Flowsheet:  *Please see Physical Therapy Exercise Flowsheet for details regarding exercises completed this session.*     11/16/20 0900   Overview   Diagnosis Right shoulder pain   Insurance [employer]   Script Date 10/20/20   Visit # 3   Additional Comments Pt reports her pain at rest and throughout the day continues to decrease. Pt reports having the most difficulty when sleeping and lying on her shoulders. Pt reports waking up multiple times throughout the night due to pain in her shoulders. Pt tolerated todays session well with minimal increase in pain or discomfort.     Pain Assessment    0-10 Scale 0   Modalities   Moist Heat Yes   Time 10   Settings sitting    Patient Education   Educated in disease process Yes   Educated in home exercise program Yes   Additional Patient Education HEP: Shoulder flexion, abduction, ER, extension with dowel, IR in sidelying, 6 way TB orange, upper trap, levator scap stretch, suboccipital release with tennis ball   Time Calculation   PT Timed Codes 30   PT Untimed Codes 10   PT Total Treatment 40     Danielle Frye, PT, DPT  Phone: [phone]  Fax: [phone]

## 2020-12-02 ENCOUNTER — Other Ambulatory Visit: Payer: BLUE CROSS/BLUE SHIELD

## 2020-12-03 ENCOUNTER — Ambulatory Visit: Payer: BLUE CROSS/BLUE SHIELD | Attending: Internal Medicine | Admitting: Physical Therapy

## 2020-12-03 DIAGNOSIS — M25511 Pain in right shoulder: Secondary | ICD-10-CM | POA: Insufficient documentation

## 2020-12-03 DIAGNOSIS — G8929 Other chronic pain: Secondary | ICD-10-CM | POA: Insufficient documentation

## 2020-12-03 NOTE — Progress Notes (Signed)
Physical Therapy Daily Flowsheet:  *Please see Physical Therapy Exercise Flowsheet for details regarding exercises completed this session.*     12/03/20 0700   Overview   Diagnosis Right shoulder pain   Insurance [employer]   Script Date 10/20/20   Visit # 4   Additional Comments Progress note: see attached   Pain Assessment    0-10 Scale 0   Functional Outcome Measures   Open a tight or new jar 1   Write 1   Turn a key 1   Prepare a meal 1   Push open a heavy door 1   Place an object on a shelf above your head 1   Do heavy household chores 1   Garden or do yard work 1   Make a bed 1   Carry a shopping bag or briefcase 1   Carry a heavy object (over 10 lbs) 1   Change a lightbulb overhead 1   Wash or blow dry your hair 2   Wash your back 1   Put on a pullover sweater 1   Use a knife to cut food 1   Recreational activities which require little effort 1   Recreational activities in which you take some force or impact through your arm, shoulder or hand 1   Recreational activities in which you move your arm freely 1   Manage transportation needs 1   Sexual activities 1   During the past week, social activities 1   During the past week, work or other regular daily activities 1   Arm, Shoulder or Hand pain 2   Arm, Shoulder or hand pain when you performed any specific activity 1   Tingling in your arm, shoulder or hand 1   Weakness in your arm, shoulder, or hand 2   Stiffness in your arm, shoulder, or hand 2   During the past week, difficulty with sleeping 4   Less Capable, less Confident, less useful 2   Disability of Arm, Shoulder, Hand Score 6.67   Using your usual technique for your work 1   Doing your usual work because of arm, shoulder or hand pain. 1   Doing your work as well as you would like? 1   Spending your usual amount of time doing your work? 1   DASH Work Module Score 0   Patient Education   Educated in disease process Yes   Educated in home exercise program Yes   Additional Patient Education HEP:  Shoulder flexion, abduction, ER, extension with dowel, IR in sidelying, 6 way TB orange, upper trap, levator scap stretch, suboccipital release with tennis ball   Time Calculation   PT Timed Codes 30   PT Untimed Codes 0   PT Total Treatment 30     Earnest Conroy, PT, DPT  Phone: [phone]  Fax: [phone]

## 2020-12-03 NOTE — Progress Notes (Signed)
Physical Therapy Exercise Flowsheet:  *Please refer to Physical Therapy Daily Flowsheet for further details of this session.*     12/03/20 0800   Shoulder Exercises   additional exercise Progress note: HEP, DASH, MMT, ROM   additional exercise Practiced HEP: 5x each- pt tolerated well    Total time 30 min therex     Earnest Conroy, PT, DPT  Phone: [phone]  Fax: [phone]

## 2020-12-03 NOTE — Progress Notes (Signed)
Department of Physical Medicine & Rehabilitation  Physical Therapy Progress Note    HISTORY:  Diagnosis: Right shoulder pain     Frequency: Once a week - inconsistent due to work schedule  Attendance: Consistent  Treatment: AROM/PROM/Therapeutic exercise, Flexibility, General conditioning, Heat, Home exercise program instructions/Patient education, Manual therapy, Postural training/body Dealer education, Strengthening, Therapeutic Activities    SUBJECTIVE:  Pt notes her shoulder is feeling much better than when starting therapy. Pt notes decreased pain and difficulty when dressing, doing her hair and completing other ADLs. Pt notes she continues to have difficulty when sleeping secondary to neck pain and shoulder discomfort. Pt notes good compliance with HEP.   Pain: Improved     OBJECTIVE:  AROM:  ROM Left Shoulder Right Shoulder   Flexion 170 at initial eval and progress note 155 at initial eval  165 At progress note   External Rotation 80 40 at initial eval   90 At progress note   Internal Rotation 80 60 at initial eval   80 At progress note   Other ()     *Indicates pain    Strength:   Strength Left Shoulder Right Shoulder   Flexion 4+ at initial eval  5 At progress note 4- at initial eval  5 At progress note   External Rotation 4+ at initial eval  5 At progress note 4+ at initial eval  5 At progress note   Internal Rotation   4+ at initial eval  5 At progress note 4- at initial eval  5 At progress note   Abduction 4+ at initial eval  5 At progress note 4+ at initial eval  5 At progress note   Supraspinatus 4+ at initial eval  5 At progress note 4- at initial eval  5 At progress note   Other ()       Joint mobility: WNL    Special Tests:    Shoulder: Neer,  negative  Michel Bickers,  negative  ER lag sign,  negative  Drop arm test,  negative   Subscapularis lift off,  negative    Functional outcome measures: DASH: 20% disability, Work: 19% disability at initial eval, 7% disability, Work: 0% disability  at progress note.     Function: Improved   Functional Tasks:     A.  Improving with ability to dress, do hair, and complete work activities with decreased pain. Increased strength and ROM throughout.        B.  Still working towards decreased pain, full return of ROM         ASSESSMENT:  Danielle Frye is a 65 y.o. female who presents to physical therapy with pain, decreased ROM, weakness, and postural deviations secondary to signs and symptoms consistent with Diagnosis: Right shoulder pain. Skilled physical therapy services are continued to be indicated to increase function and to address goals below.   Progress toward previous goals: good   Patients compliance with therapy and home exercise program:  good      PLAN:  Short term goals: 4 weeks  Pain:  Patient will report reduction in symptoms greater than 25 % of the time. -MET  HEP:  Patient will be independent with a basic home exercise program.  -MET  Patient will increase all UE range of motion by 10 degrees.  -MET  Shoulder:   Patient will be able to dress without difficulty.  -MET  Functional Outcomes:   Patient's DASH score will be less than 10%.  -MET  Long term goals: 8 weeks  Pain:  Patient will report reduction in symptoms greater than 50% of the time.  Patient will be independent with a comprehensive exercise program.  ROM/Strength:   Patient will increase all UE strength to 4+/5.  -MET  Patient will increase all UE range of motion to WNL.  Patient will be able to wash hair without difficulty.  Patient will reach behind their back to put their coat/short on without increased pain.  Functional Outcomes:   Patient's DASH work score will be less than 10%. -MET    Plan of Care:  Continue current PT program, progressing as tolerated    Thank you for the referral.  If you have any questions and/or concerns, please feel free to contact me at [phone].    Earnest Conroy, PT, DPT  Phone: [phone]  Fax: [phone]

## 2020-12-10 ENCOUNTER — Ambulatory Visit: Payer: BLUE CROSS/BLUE SHIELD | Admitting: Physical Therapy

## 2020-12-10 NOTE — Progress Notes (Signed)
Physical Therapy Daily Flowsheet:  *Please see Physical Therapy Exercise Flowsheet for details regarding exercises completed this session.*     12/10/20 1000   Overview   Diagnosis Right shoulder pain   Missed Visit No showed     Earnest Conroy, PT, DPT  Phone: [phone]  Fax: [phone]

## 2020-12-22 ENCOUNTER — Ambulatory Visit: Payer: BLUE CROSS/BLUE SHIELD | Admitting: Physical Medicine and Rehabilitation

## 2021-01-11 ENCOUNTER — Ambulatory Visit: Payer: BLUE CROSS/BLUE SHIELD

## 2021-07-21 ENCOUNTER — Telehealth: Payer: Self-pay | Admitting: Primary Care

## 2021-07-21 DIAGNOSIS — Z0000 Encounter for general adult medical examination without abnormal findings: Secondary | ICD-10-CM

## 2021-07-21 NOTE — Telephone Encounter (Signed)
Copied from [id number]. Topic: Access to Care - Labs/Orders/Imaging  >> Jul 21, 2021 11:16 AM Verda Cumins wrote:  Patient is requesting new lab orders for a physical.  She rescheduled her appt to 03/10/22. She asked that the labs be dated so she can get them shortly before her schedule physical next April.    Patient contact number is [phone] [facility]

## 2021-07-30 NOTE — Telephone Encounter (Signed)
Lab orders are entered into the system for pt.  Please let her know-  Thanks,  [place]

## 2021-08-05 ENCOUNTER — Ambulatory Visit: Payer: Medicare (Managed Care) | Admitting: Primary Care

## 2021-08-23 ENCOUNTER — Other Ambulatory Visit: Payer: Self-pay | Admitting: Gastroenterology

## 2022-03-03 ENCOUNTER — Other Ambulatory Visit
Admission: RE | Admit: 2022-03-03 | Discharge: 2022-03-03 | Disposition: A | Discharge status: Home / Self Care | Payer: Medicare (Managed Care) | Source: Ambulatory Visit | Attending: Primary Care | Admitting: Primary Care

## 2022-03-03 DIAGNOSIS — Z0000 Encounter for general adult medical examination without abnormal findings: Secondary | ICD-10-CM | POA: Insufficient documentation

## 2022-03-03 LAB — CBC
Hematocrit: 40 % (ref 34–45)
Hemoglobin: 13.2 g/dL (ref 11.2–15.7)
MCH: 30 pg (ref 26–32)
MCHC: 33 g/dL (ref 32–36)
MCV: 92 fL (ref 79–95)
Platelets: 239 10*3/uL (ref 160–370)
RBC: 4.4 MIL/uL (ref 3.9–5.2)
RDW: 12.9 % (ref 11.7–14.4)
WBC: 3.4 10*3/uL — ABNORMAL LOW (ref 4.0–10.0)

## 2022-03-03 LAB — LIPID PANEL
Chol/HDL Ratio: 3.4
Cholesterol: 264 mg/dL — AB
HDL: 78 mg/dL — ABNORMAL HIGH (ref 40–60)
LDL Calculated: 167 mg/dL — AB
Non HDL Cholesterol: 186 mg/dL
Triglycerides: 94 mg/dL

## 2022-03-03 LAB — BASIC METABOLIC PANEL
Anion Gap: 9 (ref 7–16)
CO2: 28 mmol/L (ref 20–28)
Calcium: 9.7 mg/dL (ref 8.6–10.2)
Chloride: 104 mmol/L (ref 96–108)
Creatinine: 0.84 mg/dL (ref 0.51–0.95)
Glucose: 100 mg/dL — ABNORMAL HIGH (ref 60–99)
Lab: 15 mg/dL (ref 6–20)
Potassium: 4.5 mmol/L (ref 3.3–5.1)
Sodium: 141 mmol/L (ref 133–145)
eGFR BY CREAT: 76 *

## 2022-03-03 LAB — AST: AST: 21 U/L (ref 0–35)

## 2022-03-03 LAB — ALT: ALT: 17 U/L (ref 0–35)

## 2022-03-10 ENCOUNTER — Other Ambulatory Visit: Payer: Self-pay

## 2022-03-10 ENCOUNTER — Ambulatory Visit: Payer: Medicare (Managed Care) | Admitting: Primary Care

## 2022-03-10 ENCOUNTER — Encounter: Payer: Self-pay | Admitting: Primary Care

## 2022-03-10 ENCOUNTER — Telehealth: Payer: Self-pay | Admitting: Primary Care

## 2022-03-10 VITALS — BP 134/72 | HR 77 | Temp 96.8°F | Wt 166.8 lb

## 2022-03-10 DIAGNOSIS — Z0000 Encounter for general adult medical examination without abnormal findings: Secondary | ICD-10-CM

## 2022-03-10 DIAGNOSIS — M79602 Pain in left arm: Secondary | ICD-10-CM

## 2022-03-10 DIAGNOSIS — R7301 Impaired fasting glucose: Secondary | ICD-10-CM

## 2022-03-10 DIAGNOSIS — E785 Hyperlipidemia, unspecified: Secondary | ICD-10-CM

## 2022-03-10 DIAGNOSIS — M79601 Pain in right arm: Secondary | ICD-10-CM

## 2022-03-10 MED ORDER — TETANUS-DIPHTH-ACELL PERT 5-2.5-18.5 LF-MCG/0.5 IM SUSP *WRAPPED*
0.5000 mL | Freq: Once | INTRAMUSCULAR | 0 refills | Status: AC
Start: 2022-03-10 — End: 2022-03-10

## 2022-03-10 NOTE — Telephone Encounter (Signed)
Copied from CRM [id number]. Topic: Access to Care - Speak to Provider/Office Staff  >> Mar 10, 2022 11:54 AM Danielle Frye wrote:  .Patient is calling due to not receiving her tetanus shot at her visit today 03/10/22 patient is requesting it be sent to [facility] [address] [place] [address] So she can receive it  Patient can be reached at [phone] or [phone]

## 2022-03-10 NOTE — Telephone Encounter (Signed)
Message sent to provider 

## 2022-03-10 NOTE — Patient Instructions (Addendum)
[employer] website- resources on caregiver burnout        Please call Dr. Theressa Stamps for an appt for your shoulder/ neck: [phone]    Recheck lab work in  6 months prior to appt.      Thank you for completing medicare wellness visit.    The purpose of this visits was to:    Screen for disease  Assess risk of future medical problems  Help develop a healthy lifestyle  Update vaccines  Get to know your doctor in case of an illness    Patient Care Team:  Tobin Chad, MD as PCP - General  Chestine Spore, Alpha Gula, MD as PCP - [facility]     Medicare 5 Year Plan    The following items were identified as areas of concern during your screening today:      The Health Maintenance table below identifies screening tests and immunizations recommended by your health care team:  Health Maintenance: These screening recommendations are based on USPSTF, [employer], and [place] state guidelines   Topic Date Due    DTaP/Tdap/Td Vaccines (1 - Tdap) 06/16/2005    Colon Cancer Screening  07/07/2018    Pneumococcal Vaccination (2 - PCV) 08/04/2021    COVID-19 Vaccine (4 - Booster for Pfizer series) 03/08/2027 (Originally 01/26/2021)    Flu Shot (Season Ended) 07/22/2022    Breast Cancer Screening  08/23/2022    Osteoporosis Screening  09/11/2022    Fall Risk Screening  03/11/2023    Shingles Vaccine  Completed    HIV Screening  Completed    Hepatitis C Screening  Completed    Hepatitis C Screening   Discontinued     In addition, goals and orders placed to address these recommendations are listed in the "Today's Visit" section.    We wish you the best of health and look forward to seeing you again next year for your Annual Medicare Wellness Visit.     If you have any health care concerns before then, please do not hesitate to contact us.

## 2022-03-10 NOTE — Telephone Encounter (Signed)
error 

## 2022-03-10 NOTE — Progress Notes (Signed)
Visit performed as:             Office Visit, met with patient in person    Today we reviewed and updated Danielle Frye's smoking status, activities of daily living, depression screen, fall risk, medications and allergies.   I have counseled the patient in the above areas.     Subjective:     Chief Complaint: Danielle Frye is a 66 y.o. female here for a/an No chief complaint on file.    In general, Danielle Frye rates their overall health as:  good      Patient Care Team:  Tobin Chad, MD as PCP - General  Chestine Spore, Alpha Gula, MD as PCP - [facility]     Current Outpatient Medications on File Prior to Visit   Medication Sig Dispense Refill   . Estradiol [facility]) Place vaginally     . estrogens conjugated (PREMARIN) 0.625 MG/GM vaginal cream Place vaginally twice a week     . Calcium-Vitamin D-Vitamin K (VIACTIV) 500-500-40 MG-UNT-MCG CHEW CHEW ONE TABLET BY MOUTH THREE TIMES DAILY  0   . Cholecalciferol (VITAMIN D) 1000 UNITS capsule TAKE 2 CAPSULE DAILY  0     No current facility-administered medications on file prior to visit.     Allergies   Allergen Reactions   . Latex      Created by Conversion - Rash;    . Sulfa Antibiotics      Created by Conversion - Hives;      Patient Active Problem List    Diagnosis Date Noted   . S/P colonoscopy 06/16/2016     August 2016, DR. Barratta- sessile serrated adenoma- redo 06/2018     . Non-ulcer dyspepsia 06/09/2014   . UTI (urinary tract infection) 06/09/2014   . Asthmatic Bronchitis 01/15/2008     Created by Conversion       . Prehypertension 10/30/2006     Created by Conversion         No past medical history on file.  Past Surgical History:   Procedure Laterality Date   . HYSTERECTOMY      Cesarean Hysterectomy Conversion Data    . KNEE SURGERY      Knee Surgery Conversion Data    . TUBAL LIGATION      Tubal Ligation Conversion Data      Family History   Problem Relation Age of Onset   . Conversion Other         20110506^Breast  Cancer^V16.3^Active^   . Conversion Other         [id number]^VWUJWJ Syndrome^436^Active^   . Conversion Other         20110506^Hypertension^401.9^Active^   . Conversion Other         20110506^Adopted^^Active^     Social History     Socioeconomic History   . Marital status: Married   Tobacco Use   . Smoking status: Former     Types: Cigarettes     Start date: 05/17/1992   . Smokeless tobacco: Never       Objective:     Vital Signs: BP 134/72 (BP Location: Right arm, Patient Position: Sitting, Cuff Size: large adult)   Pulse 77   Temp 36 C (96.8 F)   Wt 75.7 kg (166 lb 12.8 oz)   SpO2 98%   BMI 29.08 kg/m    BMI: Body mass index is 29.08 kg/m.    Vision Screening Results (Welcome visit only):  No results found.  Depression Screening Results:  Recent Review Flowsheet Data    There is no flowsheet data to display.       Opioid Use/DAST- 10 Screening Results:   No data recorded  Activities of Daily Living/Functional Screening Results:  No data recorded    Fall Risk Screening Results:  Have you fallen in the last year?: No  Do you feel you are at risk for falling?: No      Assessment and Plan:     Cognitive Function:  Recall of recent and remote events appears:  Abnormal - thinks since COVID shot her brain is "fuzzy" at times      Advanced Care Planning:  was discussed and patient received paperwork to review     The following health maintenance plan was reviewed with the patient:    Health Maintenance Topics with due status: Overdue       Topic Date Due    IMM DTaP/Tdap/Td 06/16/2005    Colon Cancer Screening Other 07/07/2018    COVID-19 Vaccine 01/26/2021    IMM Pneumo: 65+ Years 08/04/2021     Health Maintenance Topics with due status: Not Due       Topic Last Completion Date    IMM-INFLUENZA 08/24/2009    Osteoporosis Screening Other 09/11/2020    Breast Cancer Screening Other 08/23/2021    Fall Risk Screening 03/10/2022     Health Maintenance Topics with due status: Completed       Topic Last Completion Date     HIV Screening [facility] 08/05/2013    Hepatitis C Screening [facility]/[place] 08/05/2013    IMM-ZOSTER 01/14/2022     Health Maintenance Topics with due status: Discontinued       Topic Date Due    Hepatitis C Screening Other Discontinued     This health maintenance schedule, identified risks, a list of orders placed today and patient goals have been provided to Danielle Frye in the after visit summary.     Plan for any concerns identified during screening or risk assessments:  - will discuss memory concerns further with pt.    Internal Medicine Progress Note    CPE      HPI:   Noted more short term memory issues- "fuzzy" - since the COVID shot. She is more anxious lately.    Anxiety has been wore since her grandson was ill and her dtr ( his mother).  She is also supporting a cousin who fractured a hip.  This has been difficult.  Caregiver burden.    Both shoulders are bothering her- when she rolls over and the pressure on the shoulder awakens her.   Discomfort can radiate down the arm at time.    Reviewed lab work- will fu in 6 mos.  ASCVD risk score approx 7.6%    Last colonoscopy in 2019 - Dr. Threasa Beards- tubular adenoma with call back 2024.  Mammogram 10/22- negative  Immunizations reviewd- will update tetanus.  DEXA due in December 2023.  Has osteopenia.  Sees Dr. Gale Journey for gyn care.  Eye and dental care is up to date.  Safety precautions reviewed.              Patient's medications, allergies, past medical, surgical, social and family histories were reviewed, updated     Allergies:   Allergies as of 03/10/2022 - Up to Date 11/06/2020   Allergen Reaction Noted   . Latex  10/30/2006   . Sulfa antibiotics  10/30/2006        Home  Meds:   Prior to Admission medications    Medication Sig Start Date End Date Taking? Authorizing Provider   Estradiol [facility]) Place vaginally    [provider]   estrogens conjugated (PREMARIN) 0.625 MG/GM vaginal cream Place vaginally twice a week    [provider]    Calcium-Vitamin D-Vitamin K (VIACTIV) 500-500-40 MG-UNT-MCG CHEW CHEW ONE TABLET BY MOUTH THREE TIMES DAILY 03/26/10   Provider, Conversion   Cholecalciferol (VITAMIN D) 1000 UNITS capsule TAKE 2 CAPSULE DAILY 03/26/10   Provider, Conversion        Physical Exam:   Last set of vitals:  Vitals:    03/10/22 0856   BP: 134/72   Pulse: 77   Temp: 36 C (96.8 F)   Weight: 75.7 kg (166 lb 12.8 oz)       General: NAD  HEENT: PERRLA, EOMI, oropharynx without lesion, neck supple  Cardiac: RR no m/r/g.  Lungs: CTAB  Abd: S/ND/NT +BS  Ext: No LE edema  Neuro: alert and oriented, observed CN's intact, upper extremity strength and reflexes are intact        Assessment/Plan: Danielle Frye is here today for CPE.     1. Health care maintenance : update Tetanus and then will discuss Prevnar with her   2. Bilateral arm pain: referral back to Dr. Theressa Stamps who had previously evaluated for this and ordered cervical spine MRI    3. Hyperlipidemia, unspecified hyperlipidemia type : she would like to recheck lipid panel prior to considering cholesterol medication- she is just into the intermediate risk category.   4. IFG (impaired fasting glucose) : add Hgb A1-C to next lipid panel   5. Preventative health care              Medications/Orders:  Orders Placed This Encounter   . Tdap >/=26yr(BOOSTRIX/ADACEL) vaccine IM (AMBULATORY USE ONLY)   . Lipid Panel (Reflex to Direct  LDL if Triglycerides more than 400)   . Hemoglobin A1c   . AMB REFERRAL TO [facility]       RTO: one year and prn.    Lannie Fields, MD on 03/10/2022 at 9:27 AM

## 2022-03-14 DIAGNOSIS — N952 Postmenopausal atrophic vaginitis: Secondary | ICD-10-CM | POA: Insufficient documentation

## 2022-03-28 ENCOUNTER — Other Ambulatory Visit: Payer: Self-pay

## 2022-03-28 ENCOUNTER — Ambulatory Visit: Payer: Medicare (Managed Care) | Admitting: Physician Assistant

## 2022-03-28 VITALS — BP 136/84 | HR 105 | Temp 97.3°F | Resp 18

## 2022-03-28 DIAGNOSIS — I444 Left anterior fascicular block: Secondary | ICD-10-CM

## 2022-03-28 DIAGNOSIS — R000 Tachycardia, unspecified: Secondary | ICD-10-CM

## 2022-03-28 DIAGNOSIS — H65191 Other acute nonsuppurative otitis media, right ear: Secondary | ICD-10-CM

## 2022-03-28 DIAGNOSIS — J0100 Acute maxillary sinusitis, unspecified: Secondary | ICD-10-CM

## 2022-03-28 DIAGNOSIS — R9431 Abnormal electrocardiogram [ECG] [EKG]: Secondary | ICD-10-CM

## 2022-03-28 LAB — EKG 12-LEAD
P: 24 deg
PR: 119 ms
QRS: -49 deg
QRSD: 86 ms
QT: 339 ms
QTc: 448 ms
Rate: 105 {beats}/min
T: 262 deg

## 2022-03-28 MED ORDER — AZITHROMYCIN 250 MG PO TABS *I*
ORAL_TABLET | ORAL | 0 refills | Status: AC
Start: 2022-03-28 — End: 2022-04-02

## 2022-03-28 NOTE — Patient Instructions (Signed)
Please purchase and utilize over-the-counter Claritin or Zyrtec once daily.    You may use warm compresses (i.e. Washcloth warmed up with hot water, 10 minutes at a time) on bilateral sinuses 4-5 times daily for symptom relief.    Utilizing steam/humidified air from hot showers/steam baths may also help with sinus congestion symptoms.    Please utilize OTC nasal saline rinses or a netti pot once in the morning and once at night, to improve nasal congestion symptoms    Blow your nose, then use saline nasal spray to rinse bilaterally, blow your nose again, then use steroidal nasal spray (flonase).    Please continue to hydrate aggressively, and get plenty of rest.    You may use of over-the-counter Mucinex DM 600 mg-30 mg twice daily x 5 days for mucus/congestion/cough relief.    You may also purchase and utilize OTC Vicks vapor rub to apply to your chest as directed for cough relief.     Please seek immediate medical attention if you develop chest pain, shortness of breath, palpitations, dizziness, trouble swallowing, feeling lightheaded, or high fever.

## 2022-03-28 NOTE — UC Provider Note (Signed)
Chief Complaint:   Chief Complaint   Patient presents with   . URI     Last Tuesday night pt felt throat was "raw", laryngitis on Wednesday, Thursday pt had chills with subjective fevers and start of cough. Yellow nasal drainage and productive cough with green mucus. Feels like heart is racing at all times. Reports some episodes of dizziness with position changes from sitting to standing at home. Covid test negative last night. Took Tylenol 0430 this morning. States she is drinking a ton of fluids.         HPI:  66 y.o. female w hx as noted in chart presents to Urgent Care for URI symptoms x8 days. COVID antigen testing is negative. She admits to OTC Tylenol and increase in fluids with no relief.  She has not tried any cold/cough medications.    Symptoms are progressively worsened    Symptoms include hoarse voice, sore throat, nasal congestion, sinus pressure, cough, and tachycardia. She describes tachycardia as her "heart racing". She noticed subjective fevers prior to onset of cough.    Denies chest pain, shortness of breath, or trouble swallowing.    COVID exposure / other sick contacts: no  COVID 19 positive in the last 3 months: no  Fully vaccinated for COVID 19: yes    VITALS:  BP 136/84 (BP Location: Left arm, Patient Position: Sitting) Comment: manual per request  Pulse 105   Temp 36.3 C (97.3 F) (Temporal)   Resp 18   SpO2 97%     ROS: see HPI above      PHYSICAL EXAM:  Appearance: Well appearing, no acute distress   Eyes: no conjunctival injection or drainage   Ears: Normal LT TM and canals bilaterally. RT TM with effusion.   Nose: +nasal congestion, maxillary sinuses tender to palpation   Mouth/Throat: mild erythema of oropharynx, no petechiae or exudate, no PTA, +postnasal drip   Neck: No cervical LAD, no tenderness, full AROM   CV: RRR, no murmur   Pulm: no acute respiratory distress, Lungs clear to auscultation bilaterally   Skin: no pallor or rash     MEDICAL DECISION MAKING:  Assessment: 66  y.o. female w hx as noted in chart presents to UC with sinusitis and RT middle ear effusion. She is afebrile and appears well. HR 105 prior to discharge. EKG with sinus tachycardia. VSS. NAD.    Differential Diagnosis: Viral URI w cough, Bronchitis, Acute nasopharyngitis, COVID 19    Plan and Results:   Patient states last time she took Augmentin she got thrush and would like to try another antibiotic today.    Orders Placed This Encounter   . EKG 12 lead   . azithromycin (ZITHROMAX) 250 mg tablet       No results found for this visit on 03/28/22.    Diagnosis and Disposition:  1. Acute non-recurrent maxillary sinusitis    2. Acute MEE (middle ear effusion), right        Patient Instructions   Please purchase and utilize over-the-counter Claritin or Zyrtec once daily.    You may use warm compresses (i.e. Washcloth warmed up with hot water, 10 minutes at a time) on bilateral sinuses 4-5 times daily for symptom relief.    Utilizing steam/humidified air from hot showers/steam baths may also help with sinus congestion symptoms.    Please utilize OTC nasal saline rinses or a netti pot once in the morning and once at night, to improve nasal congestion symptoms  Blow your nose, then use saline nasal spray to rinse bilaterally, blow your nose again, then use steroidal nasal spray (flonase).    Please continue to hydrate aggressively, and get plenty of rest.    You may use of over-the-counter Mucinex DM 600 mg-30 mg twice daily x 5 days for mucus/congestion/cough relief.    You may also purchase and utilize OTC Vicks vapor rub to apply to your chest as directed for cough relief.     Please seek immediate medical attention if you develop chest pain, shortness of breath, palpitations, dizziness, trouble swallowing, feeling lightheaded, or high fever.      Encourage fluids, rest, & good hand hygiene.    Use over the counter medications as discussed.    Please start the new medications as below:        Please follow up with  your physician as below:    Follow up if symptoms worsen or fail to improve.    Given the patient's reassuring vital signs and overall well appearance, the patient can be managed safely at home. Patient advised to call PCP or return to urgent care for any worsening or concerning symptoms as reviewed and provided on AVS.

## 2022-04-11 ENCOUNTER — Encounter: Payer: Self-pay | Admitting: Gastroenterology

## 2022-04-26 ENCOUNTER — Encounter: Payer: Self-pay | Admitting: Physical Medicine and Rehabilitation

## 2022-04-26 ENCOUNTER — Other Ambulatory Visit: Payer: Self-pay

## 2022-04-26 ENCOUNTER — Ambulatory Visit: Payer: Medicare (Managed Care) | Admitting: Physical Medicine and Rehabilitation

## 2022-04-26 VITALS — BP 126/72 | HR 97

## 2022-04-26 DIAGNOSIS — M542 Cervicalgia: Secondary | ICD-10-CM

## 2022-04-26 DIAGNOSIS — M5412 Radiculopathy, cervical region: Secondary | ICD-10-CM

## 2022-04-26 NOTE — Progress Notes (Signed)
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 Neck - Follow-up     HPI: She is referred by orthopedic surgery shoulder division was treated for some shoulder pain.    She describes primary symptoms of tightness and discomfort in the right neck she is had chiropractic treatment.  She had physical therapy back in the spring 2021 was given exercises and had some modalities done at some point the neck pain actually got worse and she discontinued the therapy.  Symptoms were quite aggravated when she used exercise bands    She has taken anti-inflammatories to the grade she is able to tolerate.    She occasionally has pain that refers down towards the right shoulder but she does not have radiating pain down the arm.  She has no weakness.      followup after 2 years April 26 2022:    Still with pain neck and bilatera lshoulders. Mostly at night laying down. Especially with presure on arm. Occasionally radiates to arm and ulnar hane.     Medical History:No past medical history on file.    Surgical History:  Past Surgical History:   Procedure Laterality Date   . HYSTERECTOMY      Cesarean Hysterectomy Conversion Data    . KNEE SURGERY      Knee Surgery Conversion Data    . TUBAL LIGATION      Tubal Ligation Conversion Data              AVW:[id number] of Systems , 10 systems reviewed all normal.        Allergies:  Allergies   Allergen Reactions   . Latex      Created by Conversion - Rash;    . Sulfa Antibiotics      Created by Conversion - Hives;         Vitals:    04/26/22 1414   BP: 126/72   Pulse: 97     There is no height or weight on file to calculate BMI.  Physical Exam:    Observations: Pleasant woman awake alert oriented well-developed well-nourished no acute distress.  Presentation:.  Timely fashion well-dressed  well-groomed.  Affect:.  Normal bright and affable.  Gait and station:.  Stable nonantalgic.  Postures:.  Sitting posture is normal.    Testing:.  Informal neck range of motion and range of motion appears normal.  There is no atrophy of either upper limb or shoulder girdle.  There is no winging of the scapula either at rest or upon provocative maneuvers.  There is no tenderness over the cervical paraspinals or midline.  There is no deformity of the cervical or upper thoracic spine to visual or palpatory inspection.  There is no tenderness over the supraspinatus or proximal biceps tendon.  Shoulder range of motion does not reproduce symptoms.  Spurling's test is negative    Range of motion:.  Cervical range of motion is grossly normal.  Is a shoulder range of motion.    Skin:.  There is no lymphadenopathy in the cervical, inguinal or axillary regions.  Skin is intact in all four limbs and in the trunk.  Tibial and radial artery pulses are intact bilaterally.  Lymph:Marland Kitchen  Pulses:.    Neuromuscular:Marland Kitchen    Motor Strength:.  Strength is normal in the shoulder abductors, elbow flexors, wrist extensors and finger intrinsics bilaterally.  In the lower limbs strength is normal in the hip flexors, knee extensors, ankle dorsiflexors, and in the EHL bilaterally    Sensory Exam:.  Sensation is normal to light touch to all dermatomes in the upper and lower extremities bilaterally.    Reflexes:.  Reflexes are symmetric and normal at the biceps, triceps, brachioradialis, patellar tendon, and Achilles tendon bilaterally.  Toes are downgoing.  Romberg is negative.    Imaging:.  MRI of the cervical spine is pending    Other Diagnostics:.    Assessment:.  Chronic cervicalgia unresponsive to conservative management occluding physical therapy chiropractic and anti-inflammatories.  She does have some referred pain radiating pain possible cervical radiculitis.    Lost to foolow last year dueto illness in family.      Plan:.  We will obtain MRI  of the cervical spine to see if there is anything more specific we can approach and to rule out any occult lesions.

## 2022-05-20 ENCOUNTER — Telehealth: Payer: Self-pay

## 2022-05-20 NOTE — Telephone Encounter (Signed)
Patient wants to know if there is any prep prior to getting MR done. Patient wants to make sure she is prepared

## 2022-06-01 ENCOUNTER — Ambulatory Visit
Admission: RE | Admit: 2022-06-01 | Discharge: 2022-06-01 | Disposition: A | Discharge status: Home / Self Care | Payer: Medicare (Managed Care) | Source: Ambulatory Visit | Attending: Physical Medicine and Rehabilitation | Admitting: Physical Medicine and Rehabilitation

## 2022-06-01 ENCOUNTER — Other Ambulatory Visit: Payer: Self-pay

## 2022-06-01 DIAGNOSIS — M542 Cervicalgia: Secondary | ICD-10-CM

## 2022-06-01 DIAGNOSIS — M4722 Other spondylosis with radiculopathy, cervical region: Secondary | ICD-10-CM | POA: Insufficient documentation

## 2022-06-01 DIAGNOSIS — M4802 Spinal stenosis, cervical region: Secondary | ICD-10-CM | POA: Insufficient documentation

## 2022-06-01 DIAGNOSIS — M5412 Radiculopathy, cervical region: Secondary | ICD-10-CM

## 2022-06-08 ENCOUNTER — Ambulatory Visit: Payer: Medicare (Managed Care) | Admitting: Physical Medicine and Rehabilitation

## 2022-06-08 ENCOUNTER — Encounter: Payer: Self-pay | Admitting: Physical Medicine and Rehabilitation

## 2022-06-08 ENCOUNTER — Other Ambulatory Visit: Payer: Self-pay

## 2022-06-08 VITALS — BP 137/68 | HR 96 | Ht 63.5 in | Wt 155.0 lb

## 2022-06-08 DIAGNOSIS — M542 Cervicalgia: Secondary | ICD-10-CM

## 2022-06-08 DIAGNOSIS — M5412 Radiculopathy, cervical region: Secondary | ICD-10-CM

## 2022-06-08 NOTE — Progress Notes (Signed)
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 Neck - Follow-up     HPI: She is referred by orthopedic surgery shoulder division was treated for some shoulder pain.    She describes primary symptoms of tightness and discomfort in the right neck she is had chiropractic treatment.  She had physical therapy back in the spring 2021 was given exercises and had some modalities done at some point the neck pain actually got worse and she discontinued the therapy.  Symptoms were quite aggravated when she used exercise bands    She has taken anti-inflammatories to the grade she is able to tolerate.    She occasionally has pain that refers down towards the right shoulder but she does not have radiating pain down the arm.  She has no weakness.      followup after 2 years April 26 2022:    Still with pain neck and bilatera lshoulders. Mostly at night laying down. Especially with presure on arm. Occasionally radiates to arm and ulnar hane.     June 08, 2022:  Symptoms are stable     Mostly pain ins shoulders with carrying .    And has noted noted shoulder pain with carrying jacket onforearem  Medical History:No past medical history on file.    Surgical History:  Past Surgical History:   Procedure Laterality Date   . KNEE SURGERY      Knee Surgery Conversion Data    . TUBAL LIGATION      Tubal Ligation Conversion Data              JWJ:[id number] of Systems , 10 systems reviewed all normal.        Allergies:  Allergies   Allergen Reactions   . Latex      Created by Conversion - Rash;    . Sulfa Antibiotics      Created by Conversion - Hives;         Vitals:    06/08/22 1603   BP: 137/68   Pulse: 96   Weight: 70.3 kg (155 lb)   Height: 1.613 m (5' 3.5")     Body mass index is 27.03 kg/m.  Physical Exam:    Observations: Pleasant woman awake  alert oriented well-developed well-nourished no acute distress.  Presentation:.  Timely fashion well-dressed well-groomed.  Affect:.  Normal bright and affable.  Gait and station:.  Stable nonantalgic.  Postures:.  Sitting posture is normal.    Testing:.  Informal neck range of motion and range of motion appears normal.  There is no atrophy of either upper limb or shoulder girdle.  There is no winging of the scapula either at rest or upon provocative maneuvers.  There is no tenderness over the cervical paraspinals or midline.  There is no deformity of the cervical or upper thoracic spine to visual or palpatory inspection.  There is no tenderness over the supraspinatus or proximal biceps tendon.  Shoulder range of motion does not reproduce symptoms.  Spurling's test is negative    Range of motion:.  Cervical range of motion is grossly normal.  Is a shoulder range of motion.    Skin:.  There is no lymphadenopathy in the cervical, inguinal or axillary regions.  Skin is intact in all four limbs  and in the trunk.  Tibial and radial artery pulses are intact bilaterally.  Lymph:Marland Kitchen  Pulses:.    Neuromuscular:Marland Kitchen    Motor Strength:.  Strength is normal in the shoulder abductors, elbow flexors, wrist extensors and finger intrinsics bilaterally.  In the lower limbs strength is normal in the hip flexors, knee extensors, ankle dorsiflexors, and in the EHL bilaterally    Sensory Exam:.  Sensation is normal to light touch to all dermatomes in the upper and lower extremities bilaterally.    Reflexes:.  Reflexes are symmetric and normal at the biceps, triceps, brachioradialis, patellar tendon, and Achilles tendon bilaterally.  Toes are downgoing.  Romberg is negative.    Imaging:.  MRI of the cervical spine is pending    Other Diagnostics:.    Assessment:.  Chronic cervicalgia unresponsive to conservative management occluding physical therapy chiropractic and anti-inflammatories.  She does have some referred pain radiating pain  possible cervical radiculitis.  And much of her pain appears to be more related to shoulder and lifting and carrying    MRI recently reviewed shows no significant findings that would correlate with her bilateral shoulder pain worse with carrying      Plan:.  I will defer back to primary care to consider options for recurring or restarting physical therapy for her shoulders    I did offer the option of follow-up with interventional physiatry for shoulder evaluation.     I am going to wait to hear from Dr. Reginold Agent, who my message today, with regard to whether she wants to initiate restarting therapy or if she wants me to refer to physiatry or shoulder evaluation

## 2022-06-18 ENCOUNTER — Encounter: Payer: Self-pay | Admitting: Primary Care

## 2022-06-20 ENCOUNTER — Other Ambulatory Visit: Payer: Self-pay | Admitting: Physical Medicine and Rehabilitation

## 2022-06-20 DIAGNOSIS — M25511 Pain in right shoulder: Secondary | ICD-10-CM

## 2022-07-08 ENCOUNTER — Telehealth: Payer: Self-pay | Admitting: Primary Care

## 2022-07-08 NOTE — Telephone Encounter (Signed)
Spoke to patient regarding unread MyChart messages from Dr Chestine Spore and Dr Theressa Stamps who reports she has been out of town at a funeral.    Patient has follow-up appointment scheduled with Dr Hervey Ard but states this is where process started and she wonders if she really needs to see him.    He wanted to do cortisone shots which she is not interested in and sent her to Dr Theressa Stamps    She would like to go back to Physical Therapy for strengthening of her shoulder muscles as previously she had to quit after only 3 sessions    Please advise.

## 2022-07-15 NOTE — Telephone Encounter (Signed)
Tobin Chad, MD  Pamala Duffel  Caller: Unspecified (1 week ago, 11:38 AM)  Could you let Starlett know that Dr. Theressa Stamps thought it reasonable to keep the appt with Dr. Hervey Ard and see what he thinks.   [facility]     Phoned patient and relayed message from Dr Chestine Spore

## 2022-07-26 ENCOUNTER — Other Ambulatory Visit: Payer: Self-pay

## 2022-07-26 ENCOUNTER — Encounter: Payer: Self-pay | Admitting: Sports Medicine

## 2022-07-26 ENCOUNTER — Ambulatory Visit
Admission: RE | Admit: 2022-07-26 | Discharge: 2022-07-26 | Disposition: A | Discharge status: Home / Self Care | Payer: Medicare (Managed Care) | Source: Ambulatory Visit | Admitting: Radiology

## 2022-07-26 ENCOUNTER — Ambulatory Visit: Payer: Medicare (Managed Care) | Attending: Orthopedic Surgery | Admitting: Sports Medicine

## 2022-07-26 VITALS — BP 153/78 | HR 91 | Ht 63.5 in | Wt 155.0 lb

## 2022-07-26 DIAGNOSIS — M7541 Impingement syndrome of right shoulder: Secondary | ICD-10-CM | POA: Insufficient documentation

## 2022-07-26 DIAGNOSIS — M7542 Impingement syndrome of left shoulder: Secondary | ICD-10-CM | POA: Insufficient documentation

## 2022-07-26 DIAGNOSIS — M25511 Pain in right shoulder: Secondary | ICD-10-CM

## 2022-07-26 DIAGNOSIS — M25512 Pain in left shoulder: Secondary | ICD-10-CM | POA: Insufficient documentation

## 2022-07-26 DIAGNOSIS — M19011 Primary osteoarthritis, right shoulder: Secondary | ICD-10-CM

## 2022-07-26 DIAGNOSIS — M19012 Primary osteoarthritis, left shoulder: Secondary | ICD-10-CM

## 2022-07-26 NOTE — Progress Notes (Signed)
History:  Danielle Frye is a 66 y.o. that is being seen for evaluation of her Right shoulder pain.  Patient reports that she has been experiencing R shoulder pain on and off for at least the past 20 years, but that it has progressively worsened since this summer. She was last seen for this about 2 years ago and diagnosed with partial rotator cuff tear and impingement.  She was given an injection and sent to PT.  She did well with this, but due to life circumstances she was unable to keep up with PT.  She continued mostly with night pain and pain with increased activity.  She presents today with pain in both shoulders, right worse than left.  She did start working with a therapist who seems to be doing some soft tissue work that is helping.  She denies any acute injury. The pain wakes her up from night and her decreased ROM makes it difficult to perform some of her ADLs. She has tried  NSAIDs which provide temporary relief  The patient is left UE dominant.      Patient had an independent historian present for visit. no  Past medical history, past surgical history, medications, allergies, family history, social history, and review of systems were reviewed today and have been documented separately in this encounter.      Physical Examination:  She is in no acute distress.  She is alert and oriented x 3.   Examination of her neck demonstrates limited cervical range of motion   Skin appearance is normal with no evidence of rash or other lesions.  Palpable radial pulse.  Range of Motion: AROM  The right shoulder demonstrates active forward elevation to 160 degrees. She externally rotates to 60 degrees. She internally rotates to L3.   Strength:  Forward Elevation MMT 5/5  External rotation MMT 5/5  Internal rotation MMT /5    Special tests:  Hawkins test was positive.    Neer test was negative.    O'Brian test was negative.     Jobe's test was negative  Palpation:  There is not tenderness at the [facility] joint. There is not  tenderness along the biceps tendon. Distally she is neurovascularly intact. There is not scapular dyskinesia.  There is not scapular winging.    Imaging: I personally reviewed the patients images.X-Ray:images viewed today were AP, Lateral and Axillary views of the R shoulder demonstrate mild degenerative changes of the [facility] .  Well preserved GH Joint.        Left shoulder with mild ac joint djd, no djd of gh joint.  No acute bony abnormalities.      Assessment:  Bilateral shoulder subacromial impingement - right>Left     Plan: I discussed the diagnosis and the treatment options with the patient.  She is responding well to physical therapy.  We discussed continued conservative treatment.  Her symptoms do not warrant injection or surgical discussion at this time.  She was given a prescription for PT and will follow up as needed.    The patient was seen in conjunction with Dr. Hervey Ard today.  Please see his separate addendum for additional documentation of today's encounter.    Attending Addendum:  I personally examined the patient, reviewed the medical record to date, and performed a substantive portion of this visit working collaboratively with the APP. Details include: please see separately dictated note.

## 2022-07-26 NOTE — Progress Notes (Signed)
PATIENTGEARLDEAN, Danielle Frye  MR #:  [id number]   CSN:  [id number] DOB:  [birth year]   DICTATED BY:  Jeanett Schlein, MD DATE OF VISIT:  07/26/2022     ADDENDUM:  The patient comes in with a chief complaint of bilateral shoulder pain, right worse than left.  Discomfort is mostly over the lateral and anterior aspect of the shoulder, but it feels deep inside.  The pain occurs sometimes with activities of daily living and, occasionally, she has discomfort at night.    ASSESSMENT AND PLAN:  The patient has some myofascial pain around both shoulders and possibly subacromial bursitis.     Treatment options were discussed.  Patient was given a prescription for physical therapy.  She was advised about anti-inflammatory medications.  Further followup on an as-needed basis.     Thanks to Dr. Chestine Spore for allowing me to participate in Danielle Frye's care.             ______________________________  Jeanett Schlein, MD    IV/MODL  DD:  07/26/2022 10:56:20  DT:  07/26/2022 11:06:11  Job #:  [id number]    cc:  Lannie Fields, MD  [address]  [facility]  [place], [place] [address]

## 2022-08-23 ENCOUNTER — Other Ambulatory Visit: Payer: Self-pay | Admitting: Gastroenterology

## 2022-09-13 ENCOUNTER — Encounter: Payer: Self-pay | Admitting: Gastroenterology

## 2022-09-26 ENCOUNTER — Telehealth: Payer: Self-pay | Admitting: Primary Care

## 2022-09-26 DIAGNOSIS — Z780 Asymptomatic menopausal state: Secondary | ICD-10-CM

## 2022-09-26 NOTE — Telephone Encounter (Signed)
Copied from CRM [id number]. Topic: Access to Care - Labs/Orders/Imaging  >> Sep 26, 2022  8:57 AM Vania Rea wrote:  Lelon Mast from [facility] is calling to request an order be submitted for Danielle Frye, patient, for a Dexa Scan    The order can be submitted to [facility] at [facility] [phone]  Lab fax:[phone]    Lelon Mast can be reached with questions at [phone]

## 2022-09-27 NOTE — Telephone Encounter (Signed)
Samantha from [facility] is calling to request an order be submitted for patient, for a Dexa Scan.    Patient is scheduled for this 09/28/2022.  If they do not receive the order today, the patients appointment will be canceled.      The order can be submitted to [facility] at [facility] [phone]  Lab [phone]     Lelon Mast can be reached with questions at [phone]

## 2022-09-27 NOTE — Telephone Encounter (Signed)
Order written and printed to be faxed to [facility] 09/27/2022

## 2022-09-28 ENCOUNTER — Other Ambulatory Visit: Payer: Self-pay | Admitting: Gastroenterology

## 2022-09-29 ENCOUNTER — Ambulatory Visit: Payer: Medicare (Managed Care) | Admitting: Primary Care

## 2022-09-30 LAB — HM COLONOSCOPY

## 2022-10-04 ENCOUNTER — Encounter: Payer: Self-pay | Admitting: Primary Care

## 2022-10-18 ENCOUNTER — Encounter: Payer: Self-pay | Admitting: Gastroenterology

## 2022-11-08 ENCOUNTER — Encounter: Payer: Self-pay | Admitting: Primary Care

## 2023-03-29 ENCOUNTER — Telehealth: Payer: Self-pay | Admitting: Primary Care

## 2023-03-29 NOTE — Telephone Encounter (Signed)
Copied from CRM [id number]. Topic: Access to Care - Labs/Orders/Imaging  >> Mar 29, 2023  9:11 AM Vania Rea wrote:  Patient calling requesting a new lab requisition to be placed including Cortisol Levels, and Vitamin D.   Patient requesting a return call once labs have been placed.    Alric Quan [phone]

## 2023-03-29 NOTE — Telephone Encounter (Signed)
Please let pt know I am not sure why she would need a cortisol level and that we had an appt scheduled for this spring that looks like she cancelled.    I would need to see her to add additional lab work on.  Happy to discuss at an appt. Or a video visit.    Thanks  NSC

## 2023-03-29 NOTE — Telephone Encounter (Signed)
Spoke with patient who reports she cancelled appointment for this month and [facility] for fall due to death in the family and upcoming trips.    At this point, she does not feel she has the time to schedule a video visit.    Wanted to check cortisol level with other lab work due to stress she as been under although it is better than at last visit and she currently sleeps through the night instead of just a few hours.    If Dr Chestine Spore feels it is not necessary at this time, she respects her judgement.

## 2023-04-03 ENCOUNTER — Ambulatory Visit: Payer: Medicare (Managed Care) | Admitting: Primary Care

## 2023-08-24 ENCOUNTER — Other Ambulatory Visit: Payer: Self-pay | Admitting: Primary Care

## 2023-09-20 ENCOUNTER — Telehealth: Payer: Self-pay | Admitting: Primary Care

## 2023-09-20 DIAGNOSIS — M8580 Other specified disorders of bone density and structure, unspecified site: Secondary | ICD-10-CM

## 2023-09-20 DIAGNOSIS — Z0000 Encounter for general adult medical examination without abnormal findings: Secondary | ICD-10-CM

## 2023-09-20 NOTE — Telephone Encounter (Signed)
Copied from CRM [id number]. Topic: Access to Care - Speak to Provider/Office Staff  >> Sep 20, 2023  8:05 AM Danielle Frye wrote:  Danielle Frye is requesting a blood work order and maybe a D3 test or any other tests    That need to be run.    Patient has 10/05/2023 9:20 AM [name], MD    Please call Randa Evens at Telephone Information:  Mobile          [phone]

## 2023-09-20 NOTE — Telephone Encounter (Signed)
Message sent to Dr Clark

## 2023-09-20 NOTE — Telephone Encounter (Signed)
Writer returned call to patient to relay that Dr Chestine Spore has placed the lab orders, and IF patient fasts for these to please remember to drink water before getting them drawn.     Patient extends the appreciation to Dr Chestine Spore for the lab draws

## 2023-09-21 ENCOUNTER — Other Ambulatory Visit: Payer: Self-pay | Admitting: Primary Care

## 2023-09-21 ENCOUNTER — Other Ambulatory Visit
Admission: RE | Admit: 2023-09-21 | Discharge: 2023-09-21 | Disposition: A | Discharge status: Home / Self Care | Payer: Medicare (Managed Care) | Source: Ambulatory Visit | Attending: Primary Care | Admitting: Primary Care

## 2023-09-21 DIAGNOSIS — M8580 Other specified disorders of bone density and structure, unspecified site: Secondary | ICD-10-CM | POA: Insufficient documentation

## 2023-09-21 DIAGNOSIS — D72819 Decreased white blood cell count, unspecified: Secondary | ICD-10-CM

## 2023-09-21 DIAGNOSIS — Z0000 Encounter for general adult medical examination without abnormal findings: Secondary | ICD-10-CM | POA: Insufficient documentation

## 2023-09-21 LAB — LIPID PANEL
Chol/HDL Ratio: 3.1
Cholesterol: 199 mg/dL
HDL: 65 mg/dL — ABNORMAL HIGH (ref 40–60)
LDL Calculated: 120 mg/dL
Non HDL Cholesterol: 134 mg/dL
Triglycerides: 71 mg/dL

## 2023-09-21 LAB — COMPREHENSIVE METABOLIC PANEL
ALT: 12 U/L (ref 0–35)
AST: 23 U/L (ref 0–35)
Albumin: 3.9 g/dL (ref 3.5–5.2)
Alk Phos: 55 U/L (ref 35–105)
Anion Gap: 11 (ref 7–16)
Bilirubin,Total: 0.4 mg/dL (ref 0.0–1.2)
CO2: 26 mmol/L (ref 20–28)
Calcium: 9.3 mg/dL (ref 8.6–10.2)
Chloride: 101 mmol/L (ref 96–108)
Creatinine: 0.79 mg/dL (ref 0.51–0.95)
Glucose: 92 mg/dL (ref 60–99)
Lab: 8 mg/dL (ref 6–20)
Potassium: 4.4 mmol/L (ref 3.3–5.1)
Sodium: 138 mmol/L (ref 133–145)
Total Protein: 6.2 g/dL — ABNORMAL LOW (ref 6.3–7.7)
eGFR BY CREAT: 81 *

## 2023-09-21 LAB — CBC AND DIFFERENTIAL
Baso # K/uL: 0 10*3/uL (ref 0.0–0.2)
Eos # K/uL: 0.1 10*3/uL (ref 0.0–0.5)
Hematocrit: 38 % (ref 34–49)
Hemoglobin: 12.2 g/dL (ref 11.2–16.0)
IMM Granulocytes #: 0 10*3/uL (ref 0.0–0.0)
IMM Granulocytes: 0 %
Lymph # K/uL: 1.3 10*3/uL (ref 1.0–5.0)
MCV: 95 fL (ref 75–100)
Mono # K/uL: 0.3 10*3/uL (ref 0.1–1.0)
Neut # K/uL: 1 10*3/uL — ABNORMAL LOW (ref 1.5–6.5)
Nucl RBC # K/uL: 0 10*3/uL (ref 0.0–0.0)
Nucl RBC %: 0 /100{WBCs} (ref 0.0–0.2)
Platelets: 212 10*3/uL (ref 150–450)
RBC: 4 MIL/uL (ref 4.0–5.5)
RDW: 12.4 % (ref 0.0–15.0)
Seg Neut %: 36.4 %
WBC: 2.7 10*3/uL — ABNORMAL LOW (ref 3.5–11.0)

## 2023-09-21 LAB — HEMOGLOBIN A1C: Hemoglobin A1C: 5.7 % — ABNORMAL HIGH

## 2023-09-21 LAB — VITAMIN D: 25-OH Vit Total: 34 ng/mL (ref 30–60)

## 2023-10-05 ENCOUNTER — Ambulatory Visit: Payer: Medicare (Managed Care) | Admitting: Primary Care

## 2023-10-05 ENCOUNTER — Other Ambulatory Visit: Payer: Self-pay

## 2023-10-05 ENCOUNTER — Encounter: Payer: Self-pay | Admitting: Primary Care

## 2023-10-05 VITALS — Temp 97.2°F | Wt 160.4 lb

## 2023-10-05 DIAGNOSIS — Z9889 Other specified postprocedural states: Secondary | ICD-10-CM

## 2023-10-05 DIAGNOSIS — D72819 Decreased white blood cell count, unspecified: Secondary | ICD-10-CM

## 2023-10-05 DIAGNOSIS — Z0000 Encounter for general adult medical examination without abnormal findings: Secondary | ICD-10-CM

## 2023-10-05 DIAGNOSIS — E785 Hyperlipidemia, unspecified: Secondary | ICD-10-CM

## 2023-10-05 MED ORDER — PNEUMOCOCCAL 20-VAL CONJ VACC 0.5 ML IM SUSY *I*
0.5000 mL | PREFILLED_SYRINGE | Freq: Once | INTRAMUSCULAR | 0 refills | Status: AC
Start: 2023-10-05 — End: 2023-10-05

## 2023-10-05 NOTE — Progress Notes (Signed)
Internal Medicine Progress Note    Follow up      HPI:   Danielle Frye returns for fu.  She has concerns about her grandson which we discussed today.   She is trying to put herself first in terms of health care a bit more recently.  She is going to see a new gyn.  She is not taking any of the estrogen as she was unable to get a refill.    Immunizations updated.      We reviewed lab work in detail.  Discussed her leukopenia and e consult referral to hematology.  Her ASCVD risk score with her cholesterol is elevated, will fu at next visit to discuss this and possible treatment.      Patient's medications, allergies, past medical, surgical, social and family histories were reviewed, updated     Allergies:   Allergies as of 10/05/2023 - Up to Date 07/26/2022   Allergen Reaction Noted    Latex  10/30/2006    Sulfa antibiotics  10/30/2006        Home Meds:   Prior to Admission medications    Medication Sig Start Date End Date Taking? Authorizing Provider   Omega-3 1000 MG CAPS Take 1 tablet by mouth daily    [provider]   Estradiol 10 MCG TABS Place 1 tablet vaginally twice a week 02/23/22   [provider]   estrogens conjugated (PREMARIN) 0.625 MG/GM vaginal cream Place vaginally twice a week    [provider]   Calcium-Vitamin D-Vitamin K (VIACTIV) 500-500-40 MG-UNT-MCG CHEW CHEW ONE TABLET BY MOUTH THREE TIMES DAILY 03/26/10   Provider, Conversion   Cholecalciferol (VITAMIN D) 1000 UNITS capsule TAKE 2 CAPSULE DAILY 03/26/10   Provider, Conversion        Physical Exam:   Last set of vitals:  Vitals:    10/05/23 0915   Temp: 36.2 C (97.2 F)   Weight: 72.8 kg (160 lb 6.4 oz)     126/74 left arm seated regular cuff, 142/70 right arm.  General: NAD, well appearing  Cardiac: RR no m/r/g.  Lungs: CTAB  Abd: S/ND/NT +BS  Ext: No LE edema.        Assessment/Plan: Danielle Frye is here today for follow up:    1. S/P colonoscopy : 09/2022 Dr. Threasa Beards- tubular adenoma   2. Leukopenia, unspecified type : e  consult to hematology   3. Preventative health care    4. Health care maintenance: due for Prevnar 20   5. Hyperlipidemia, unspecified hyperlipidemia type : discuss further at fu appt.             Medications/Orders:  Orders Placed This Encounter    pneumococcal 20-valent conjugate (PREVNAR 20) vaccine       RTO: 2 mos.    Lannie Fields, MD on 10/05/2023 at 9:50 AM Visit performed as:            Office Visit, met with patient in person    Today we reviewed and updated Danielle Frye's smoking status, activities of daily living, depression screen, fall risk, medications and allergies.   I have counseled the patient in the above areas.     Subjective:     Chief Complaint: Danielle Frye is a 67 y.o. female here for a/an No chief complaint on file.    In general, Danielle Frye rates their overall health as:  "Okay but have noticed changes"      Patient Care Team:  Tobin Chad,  MD as PCP - General     Current Outpatient Medications on File Prior to Visit   Medication Sig Dispense Refill    Omega-3 1000 MG CAPS Take 1 tablet by mouth daily      Estradiol 10 MCG TABS Place 1 tablet vaginally twice a week      estrogens conjugated (PREMARIN) 0.625 MG/GM vaginal cream Place vaginally twice a week      Calcium-Vitamin D-Vitamin K (VIACTIV) 500-500-40 MG-UNT-MCG CHEW CHEW ONE TABLET BY MOUTH THREE TIMES DAILY  0    Cholecalciferol (VITAMIN D) 1000 UNITS capsule TAKE 2 CAPSULE DAILY  0     No current facility-administered medications on file prior to visit.     Allergies   Allergen Reactions    Latex      Created by Conversion - Rash;     Sulfa Antibiotics      Created by Conversion - Hives;      Patient Active Problem List    Diagnosis Date Noted    S/P colonoscopy 06/16/2016     August 2016, DR. Barratta- sessile serrated adenoma- redo 06/2018      Non-ulcer dyspepsia 06/09/2014    UTI (urinary tract infection) 06/09/2014    Asthmatic Bronchitis 01/15/2008     Created by Conversion        Prehypertension 10/30/2006      Created by Conversion         No past medical history on file.  Past Surgical History:   Procedure Laterality Date    KNEE SURGERY      Knee Surgery Conversion Data     TUBAL LIGATION      Tubal Ligation Conversion Data      Family History   Problem Relation Age of Onset    Conversion Other         20110506^Breast Cancer^V16.3^Active^    Conversion Other         20110506^Stroke Syndrome^436^Active^    Conversion Other         20110506^Hypertension^401.9^Active^    Conversion Other         20110506^Adopted^^Active^     Social History     Socioeconomic History    Marital status: Married   Tobacco Use    Smoking status: Former     Types: Cigarettes     Start date: 05/17/1992    Smokeless tobacco: Never       Objective:     Vital Signs: Temp 36.2 C (97.2 F)   Wt 72.8 kg (160 lb 6.4 oz)   SpO2 98%   BMI 27.97 kg/m    BMI: Body mass index is 27.97 kg/m.    Vision Screening Results (Welcome visit only):  No results found.    Depression Screening Results:  Review Flowsheet          10/05/2023   PHQ Scores   PHQ Calculated Score 0       Details          Patient-reported             Promis Cat V1.0 - Depression    10/05/2023  9:14 AM EST - Filed by Patient   I felt depressed Never   I felt unhappy Never   I felt sad Sometimes   I felt discouraged about the future Rarely   I felt disappointed in myself Never   PROMIS Depression T-Score (range: 10 - 90) 46 (within normal limits)        Opioid Use/DAST-  10 Screening Results:   How many times in the past year have you used an illegal drug or used a prescription medication for nonmedical reasons?: 0 (10/05/2023  9:56 AM)    Activities of Daily Living/Functional Screening Results:  Is the person deaf or does he/she have serious difficulty hearing?: N (10/05/2023  9:56 AM)  Is this person blind or does he/she have serious difficulty seeing even when wearing glasses?: N (10/05/2023  9:56 AM)  Does this person have serious difficulty walking or climbing stairs?: N (10/05/2023   9:56 AM)  Does this person have difficulty dressing or bathing?: N (10/05/2023  9:56 AM)  *Shopping: Independent (10/05/2023  9:56 AM)  *House Keeping: Independent (10/05/2023  9:56 AM)  *Managing Own Medications: Independent (10/05/2023  9:56 AM)  *Handling Finances: Independent (10/05/2023  9:56 AM)      Fall Risk Screening Results:  Have you fallen in the last year?: No (10/05/2023  9:56 AM)  Do you feel you are at risk for falling?: No (10/05/2023  9:56 AM)      Assessment and Plan:     Cognitive Function:  Recall of recent and remote events appears:  Normal      Advanced Care Planning:  was discussed and patient received paperwork to review     The following health maintenance plan was reviewed with the patient:    Health Maintenance Topics with due status: Overdue       Topic Date Due    IMM Pneumo: 65+ Years 08/04/2021    IMM-Influenza 07/23/2023    COVID-19 Vaccine 07/23/2023    Fall Risk Screening 07/27/2023     Health Maintenance Topics with due status: Not Due       Topic Last Completion Date    IMM DTaP/Tdap/Td 03/11/2022    Osteoporosis Screening Other 09/28/2022    Colon Cancer Screening Other 09/30/2022    Breast Cancer Screening Other 08/24/2023    Depression Screen Yearly 10/05/2023     Health Maintenance Topics with due status: Completed       Topic Last Completion Date    HIV Screening [place] 08/05/2013    Hepatitis C Screening [place] 08/05/2013    IMM-Zoster 01/14/2022     Health Maintenance Topics with due status: Aged [employer] Date Due    IMM-Hepatitis B Vaccine Aged Out    IMM-HIB 0-5 Yrs or At-Risk Patients Aged Out    IMM-HPV 9-26 Yrs or Shared Decision (27-45 Yrs) Aged Out    IMM-MCV4 0-18 Yrs or At-Risk Patients Aged Out    IMM-Rotavirus 0-8 Months Aged Out     This health maintenance schedule, identified risks, a list of orders placed today and patient goals have been provided to [employer] in the after visit summary.     Plan for any concerns identified during screening  or risk assessments:  -discussed with patient.

## 2023-10-05 NOTE — Patient Instructions (Addendum)
Please check blood pressure three times per week for two weeks and then send the results via my chart.    I sent a consult to hematology regarding your slightly low white blood cell count.    New pneumonia vaccine sent to [facility].            Thank you for completing your No chief complaint on file.   with [name] today.     The purpose of this visits was to:    Screen for disease  Assess risk of future medical problems  Help develop a healthy lifestyle  Update vaccines  Get to know your doctor in case of an illness    Patient Care Team:  Tobin Chad, MD as PCP - General     Medicare 5 Year Plan    The following items were identified as areas of concern during your screening today:  BMI greater than 25 - This is a risk for Heart Attack, Stroke, High Blood Pressure, Diabetes, High Cholesterol and other complications.       The Health Maintenance table below identifies screening tests and immunizations recommended by your health care team:  Health Maintenance: These screening recommendations are based on [place]PST[place], [employer], and [place] state guidelines   Topic Date Due    Pneumococcal Vaccination (2 of 2 - PCV) 08/04/2021    Flu Shot (1) 07/23/2023    COVID-19 Vaccine (4 - 2024-25 season) 07/23/2023    Breast Cancer Screening  08/23/2024    Osteoporosis Screening  09/28/2024    Depression - Yearly  10/04/2024    Fall Risk Screening  10/04/2024    Colon Cancer Screening  10/01/2027    DTaP/Tdap/Td Vaccines (2 - Td or Tdap) 03/11/2032    Shingles Vaccine  Completed    HIV Screening  Completed    Hepatitis C Screening  Completed    Hepatitis B Vaccine  Aged Out    HIB Vaccine  Aged Out    HPV Vaccine  Aged Out    Meningococcal Vaccine  Aged Out    Rotavirus Vaccine  Aged Out     In addition, goals and orders placed to address these recommendations are listed in the "Today's Visit" section.    We wish you the best of health and look forward to seeing you again next year for your Annual Medicare Wellness Visit.     If  you have any health care concerns before then, please do not hesitate to contact us.

## 2023-10-08 NOTE — Result Encounter Note (Signed)
Discussed with patient at time of visit.   E consult to hematology placed for leukopenia.  NSC

## 2023-10-15 ENCOUNTER — Other Ambulatory Visit: Payer: Medicare (Managed Care) | Admitting: Hematology

## 2023-10-15 DIAGNOSIS — D709 Neutropenia, unspecified: Secondary | ICD-10-CM

## 2023-10-15 NOTE — Provider Consult (Signed)
Consulting Provider Response     Question:  I am requesting an e-Consult for my patient, Danielle Frye, a 67 y.o. year old female with Leukopenia.     MY CLINICAL QUESTION:  does patient need further workup for leukopenia and if so, recommendations.     Recommendations:  Given the duration of mild neutropenia an indolent myeloid disorder cannot be ruled out at this time. Would recommend ruling out micronutrient deficiency with iron studies including ferritin, folate, and vitamin B12 and treat if deficiency identified, as well viral infections (HIV, Hep B and C). Alcohol overuse can also result in cytopenias so would recommend screening your patient, as well as making sure they are up to date on age-appropriate cancer screening to rule out occult malignancy.    If the screening studies above are negative then consider referring your patient for formal evaluation if there is persistence of ANC <1000/uL or recurrent infections, constitutional symptoms (fevers, night sweats, unintentional weight loss), unexplained bleeding or bruising symptoms.      Rationale:  Neutropenia    Component      Latest Ref Rng 07/23/2019 03/03/2022 09/21/2023   WBC      3.5 - 11.0 THOU/uL 3.0 (L)  3.4 (L)  2.7 (L)    RBC      4.0 - 5.5 MIL/uL 4.4  4.4  4.0    Hemoglobin      11.2 - 16.0 g/dL 03.4  91.7  91.5    Hematocrit      34 - 49 % 41  40  38    MCV      75 - 100 fL 94  92  95    MCH      26 - 32 pg 31  30     MCHC      32 - 36 g/dL 33  33     RDW      0.0 - 15.0 % 12.3  12.9  12.4    Platelets      150 - 450 THOU/uL 216  239  212    Seg Neut %      %   36.4    Neut # K/uL      1.5 - 6.5 THOU/uL   1.0 (L)    Lymph # K/uL      1.0 - 5.0 THOU/uL   1.3    Mono # K/uL      0.1 - 1.0 THOU/uL   0.3    Eos # K/uL      0.0 - 0.5 THOU/uL   0.1    Baso # K/uL      0.0 - 0.2 THOU/uL   0.0    Nucl RBC %      0.0 - 0.2 /100 WBC   0.0    Nucl RBC # K/uL      0.0 - 0.0 THOU/uL   0.0    IMM Granulocytes #      0.0 - 0.0 THOU/uL   0.0    IMM Granulocytes       %   0.0       Contingency:  NA    Time Spent:     Kirt Boys, MD        This eConsult is focused on the specific clinical question(s) asked by the referring clinician, is based on the clinical data available to me, the consulting physician at the time of the request, and is furnished without benefit of a  comprehensive evaluation of physical examination of the patient by me. The guidance set forth in the eConsult note will need to be interpreted in light of any clinical issues not known to me or any changes in patient status that I may not be aware of at the time of filing this eConsult. If further consultation is necessary, an in-person visit with me or another member of our group is an option.

## 2023-10-17 ENCOUNTER — Encounter: Payer: Self-pay | Admitting: Primary Care

## 2023-10-17 ENCOUNTER — Other Ambulatory Visit: Payer: Medicare (Managed Care) | Admitting: Primary Care

## 2023-10-17 DIAGNOSIS — D72819 Decreased white blood cell count, unspecified: Secondary | ICD-10-CM

## 2023-10-17 NOTE — Progress Notes (Signed)
Ordering Provider Response       Thank you for this consultation.  Lab work ordered and communicated to patient.  Will see her in fu and reassess.  Signed by Silas [place]. Chestine Spore, M.D. 10/17/2023 at 1:58 PM

## 2023-11-17 ENCOUNTER — Encounter: Payer: Self-pay | Admitting: Primary Care

## 2023-12-05 ENCOUNTER — Other Ambulatory Visit
Admission: RE | Admit: 2023-12-05 | Discharge: 2023-12-05 | Disposition: A | Discharge status: Home / Self Care | Payer: Medicare (Managed Care) | Source: Ambulatory Visit | Attending: Primary Care | Admitting: Primary Care

## 2023-12-05 DIAGNOSIS — D72819 Decreased white blood cell count, unspecified: Secondary | ICD-10-CM | POA: Insufficient documentation

## 2023-12-05 LAB — HEPATITIS B PROF
HBV Core Ab: NEGATIVE
HBV S Ab Quant: 0.3 m[IU]/mL
HBV S Ab: NEGATIVE
HBV S Ag: NEGATIVE

## 2023-12-05 LAB — HEPATITIS C VIRUS ANTIBODY WITH REFLEX TO HEPATITIS C QUANTITATIVE: Hep C Ab: NEGATIVE

## 2023-12-05 LAB — TIBC
Iron: 75 ug/dL (ref 34–165)
TIBC: 333 ug/dL (ref 250–450)
Transferrin Saturation: 23 % (ref 15–50)

## 2023-12-05 LAB — FOLATE: Folate: >14 ng/mL (ref >=4.6)

## 2023-12-05 LAB — HIV-1/2  ANTIGEN/ANTIBODY SCREEN WITH CONFIRMATION: HIV 1&2 ANTIGEN/ANTIBODY: NONREACTIVE

## 2023-12-05 LAB — FERRITIN: Ferritin: 131 ng/mL — ABNORMAL HIGH (ref 10–120)

## 2023-12-05 LAB — VITAMIN B12: Vitamin B12: 309 pg/mL (ref 232–1245)

## 2023-12-06 IMAGING — OT DXA BONE DENSITY
2 series · 2 of 2 positions shown · non-contrast
Comparison: none

REASON FOR EXAM: Osteoporosis

RISK FACTORS:  None reported.
PRIOR EXAMS:  DXA from [HOSPITAL] dated 10/07/2019.
METHOD:  Scans of the spine between L1-L4 and hip were performed using dual energy X-ray densitometry (DXA)

[Series 1: — · 1 of 1 slices shown (1 of 2)]
[im 1/1]
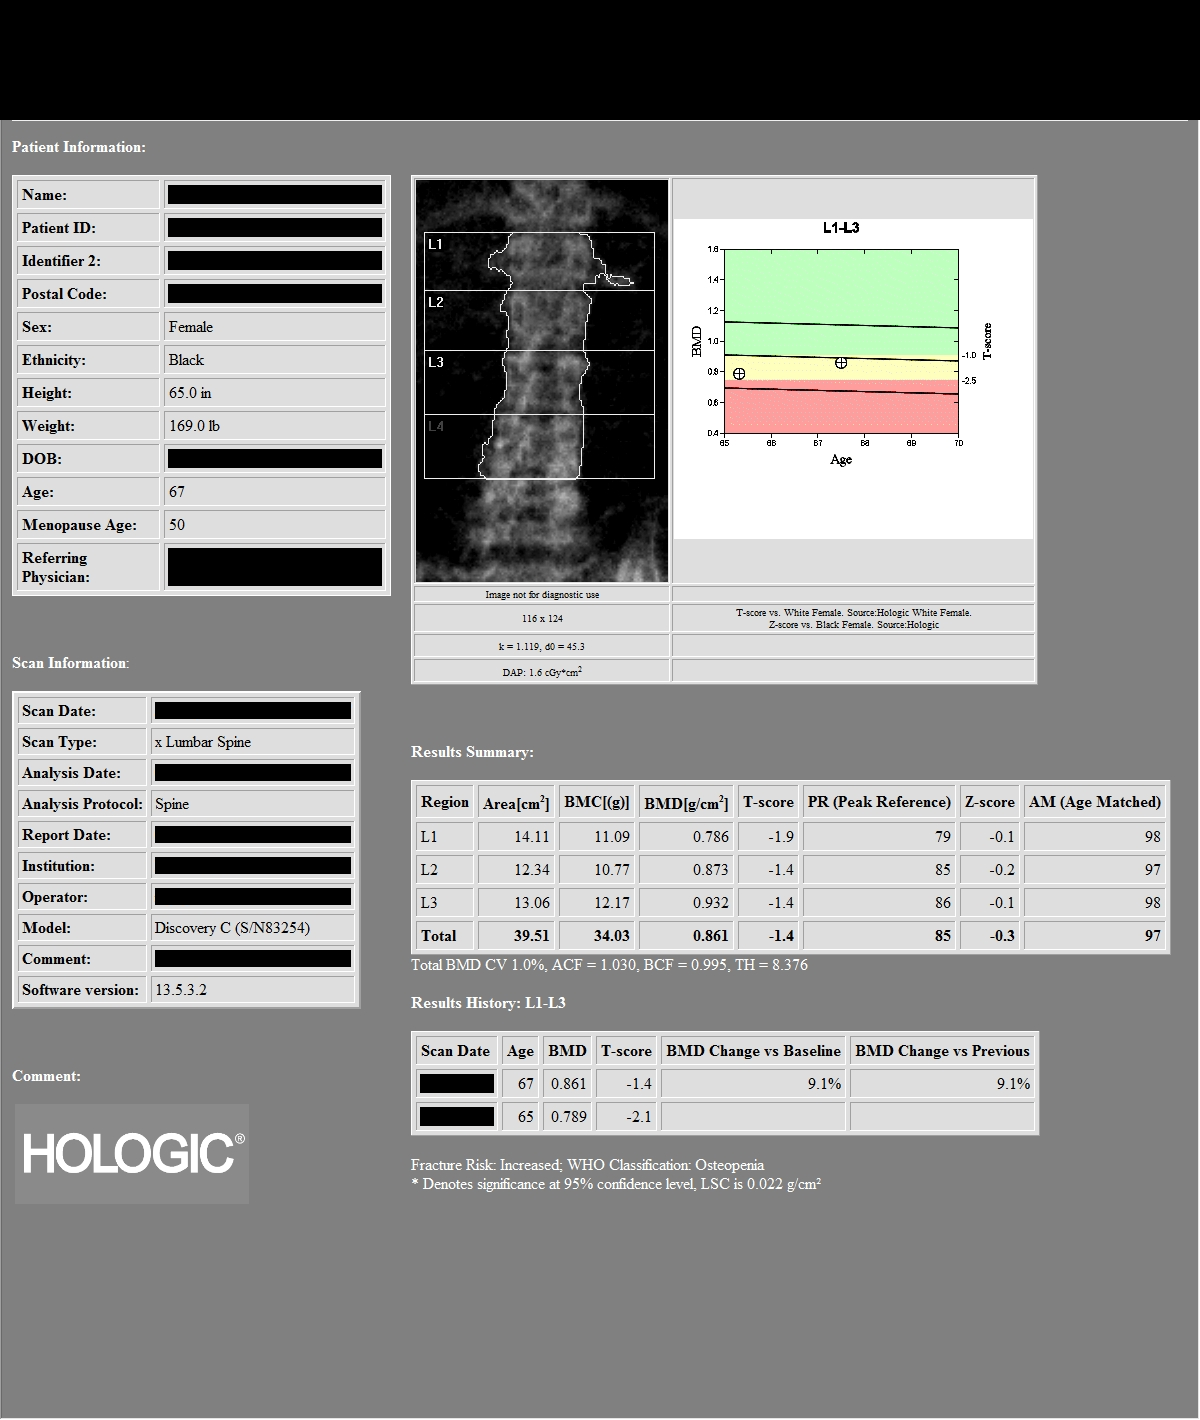

[Series 2: — · left · 1 of 1 slices shown (2 of 2)]
[im 1/1]
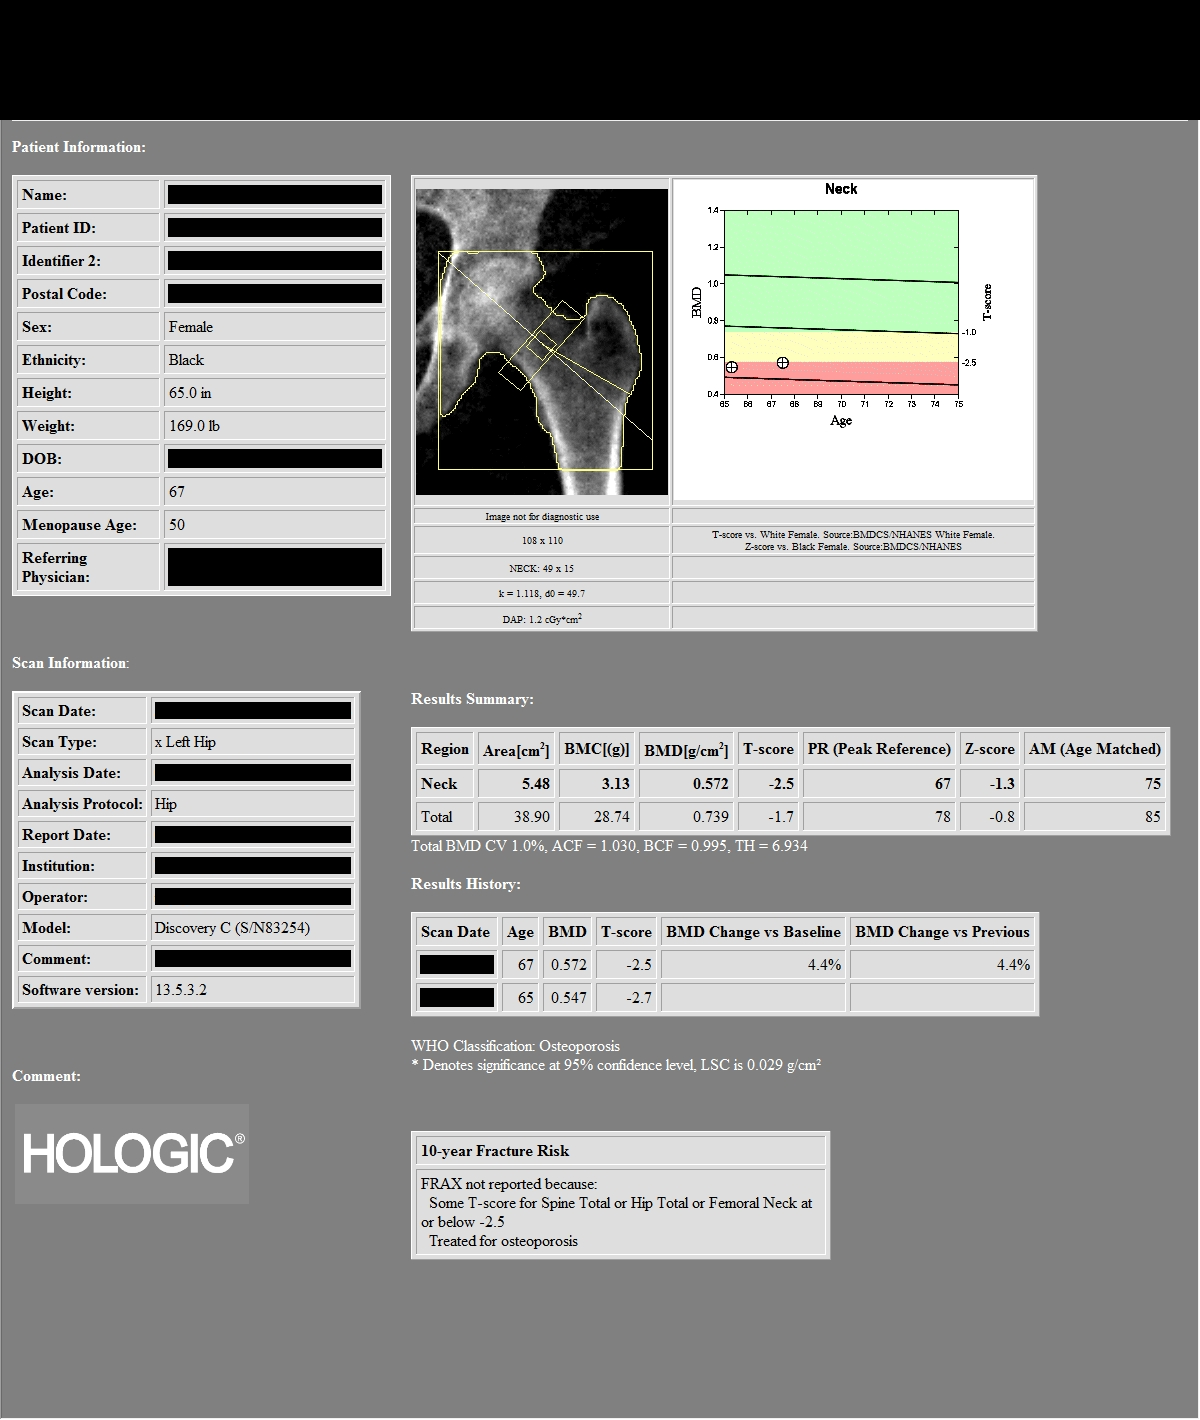

[2 of 2 positions shown; findings below may reference images not displayed]

IMPRESSION: As defined by World Health Organization, the patient meets the criteria for OSTEOPOROSIS based on left hip T-scores.

PATIENT DEMOGRAPHICS:  65-year-old black  female.
FINDINGS: 1.    Review of scanogram images shows no factor invalidating scan results.  

2.    The lumbar spine exam using L1-L3 regions shows average Bone Mineral Density is 0.861 gm/cm2 of Hydroxyapatite.  The T-score (comparing patient with a young adult group) is 1.4 standard deviations BELOW mean. The Z-score (comparing patient with an age-matched group) is 0.3 standard deviations BELOW mean.

COMPARED TO PRIOR DXA, spine bone density was 0.789 gm/cm2.  This is an interval increase of 0.072 gm/cm2 or 9.1 %. Technologist least significant change in the spine is 0.030 gm/cm2. This is a statistically significant interval increase.

3.  The left hip exam using femoral neck region of interest shows average Bone Mineral Density is 0.572 gm/cm2 of Hydroxyapatite. The T-score (comparing patient with a young adult group) is 2.5 standard deviations BELOW mean. The Z-score (comparing patient with an age-matched group) is 1.3 standard deviations BELOW mean.

COMPARED TO PRIOR DXA, hip bone density was 0.547 gm/cm2.  This is an interval increase of 0.025 gm/cm2 or 4.4 %. Technologist least significant change in the hip is 0.032 gm/cm2. This is not a statistically significant interval increase.

RECOMMENDATIONS:  The patient states that she is taking vitamin D supplements on a regular basis.  The patient should continue being a nonsmoker and regular exercise to patient tolerance would be of benefit. The patient reports taking alendronate for the past 2 years for the prevention of bone loss. The National Osteoporosis Foundation now recommends followup DXA scanning every two years in patients at risk regardless of whether the patient is undergoing pharmacological treatment.

## 2023-12-07 NOTE — Result Encounter Note (Signed)
 Please let me know if you think any other workup is indicated.  This was fu from e -consult for leukopenia.  Best,  NSC

## 2023-12-08 ENCOUNTER — Other Ambulatory Visit: Payer: Self-pay

## 2023-12-08 ENCOUNTER — Other Ambulatory Visit
Admission: RE | Admit: 2023-12-08 | Discharge: 2023-12-08 | Disposition: A | Discharge status: Home / Self Care | Payer: Medicare (Managed Care) | Source: Ambulatory Visit | Attending: Obstetrics and Gynecology | Admitting: Obstetrics and Gynecology

## 2023-12-08 ENCOUNTER — Ambulatory Visit: Payer: Medicare (Managed Care) | Admitting: Obstetrics and Gynecology

## 2023-12-08 VITALS — BP 140/80

## 2023-12-08 DIAGNOSIS — N951 Menopausal and female climacteric states: Secondary | ICD-10-CM

## 2023-12-08 DIAGNOSIS — Z01419 Encounter for gynecological examination (general) (routine) without abnormal findings: Secondary | ICD-10-CM

## 2023-12-08 LAB — ESTRADIOL: Estradiol: <25 pg/mL

## 2023-12-08 LAB — SEX HORMONE BINDING GLOBULIN: Sex Hormone Binding Glob: 92 nmol/L (ref 20–130)

## 2023-12-08 LAB — TESTOSTERONE, FREE BY IMMUNOASSAY (ADULT MALES OR INDIVIDUALS ON TESTOSTERONE HORMONE THERAPY)
Testosterone,% Free: 1 %
Testosterone,Free: 1 pg/mL
Testosterone: 15 ng/dL (ref 0–41)

## 2023-12-08 LAB — FOLLICLE STIMULATING HORMONE: FSH: 126 m[IU]/mL

## 2023-12-08 LAB — TSH: TSH: 1.97 u[IU]/mL (ref 0.27–4.20)

## 2023-12-08 LAB — DHEA-SULFATE: DHEA Sulfate: 87 ug/dL (ref 9–246)

## 2023-12-08 MED ORDER — PROGESTERONE 100 MG PO CAPS *I*
ORAL_CAPSULE | ORAL | 0 refills | Status: DC
Start: 2023-12-08 — End: 2024-03-12

## 2023-12-08 MED ORDER — ESTRADIOL 0.025 MG/24HR TD PT TWICE WEEKLY *I*
1.0000 | MEDICATED_PATCH | TRANSDERMAL | 0 refills | Status: DC
Start: 2023-12-11 — End: 2024-02-15

## 2023-12-08 MED ORDER — ESTRADIOL 0.1 MG/GM VA CREA *I*: TOPICAL_CREAM | VAGINAL | 2 refills | Status: AC

## 2023-12-08 NOTE — Progress Notes (Signed)
 The day of the patient's visit I reviewed her entire electronic chart including medications, problems, and last visit clinical notes.    Danielle Frye is a 68 y.o. female here for Management of menopause as a new patient   Her specific menopause complaints are see below     Gynecologic History:  No LMP recorded.     OB History    No obstetric history on file.         Patient Active Problem List   Diagnosis Code    Prehypertension R03.0    Asthmatic Bronchitis J45.909    Non-ulcer dyspepsia K30    UTI (urinary tract infection) N39.0    S/P colonoscopy Z98.890       Allergies   Allergen Reactions    Latex      Created by Conversion - Rash;     Sulfa Antibiotics      Created by Conversion - Hives;        Current Outpatient Medications   Medication    ascorbic acid 500 mg tablet    Multiple Vitamins-Minerals (VITAMIN D3 COMPLETE PO)    OMEGA 3-5-6-7-9 FATTY ACIDS PO    Estradiol 10 MCG TABS    estrogens conjugated (PREMARIN) 0.625 MG/GM vaginal cream    [START ON 12/11/2023] estradiol (VIVELLE-DOT) 0.025 MG/24HR patch    progesterone (PROMETRIUM) 100 MG capsule    estradiol (ESTRACE) 0.1 MG/GM vaginal cream     No current facility-administered medications for this visit.       ROS:  Per HPI  CONSTITUTIONAL: Appetite good, no fevers, night sweats or weight loss  GI: No nausea/vomiting, abdominal pain, or change in bowel habits  GU: No dysuria, urgency or incontinence  PSYCH: No depression or anxiety    Objective:  BP 140/80 (BP Location: Right arm, Patient Position: Sitting, Cuff Size: adult)      GENERAL: 68 y.o. year y/o female in NAD.    MENTAL STATUS: Alert, normal MS. Answers all questions appropriately.    This is a 68 year old female new to this practice who presents for menopause symptoms.  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    There are 4 children age 73, 47, 19, 75.    The patient is up-to-date with  colonoscopy Pap smears and mammograms.    Her overall health history includes 3 knee surgeries, 4 cesarean sections, cataract surgery and tubal ligation.    45-minute consultation gave me the opportunity to review the biology of menopause utilizing a number of visual aids.  She filled out my menopause questionnaire indicating significant issues with sleep disorders, vaginal dryness, pain on intercourse, loss of libido, weight gain, lesser issues of mood swings, memory changes, urine loss from urgency, urine loss from stress, osteoporosis risk and no issues of joint pain, hot flashes or heart disease.  Currently she is on no hormone management.    To understand better her current status I ordered TSH, FSH, DHEA-sulfate, estradiol, testosterone.  Plan is to see the patient in 3 months.  In the meantime I have placed her on a low-dose estradiol patch at 0.025 mg for 24 hours and progesterone 100 mg each night.

## 2023-12-12 ENCOUNTER — Telehealth: Payer: Self-pay | Admitting: Primary Care

## 2023-12-12 ENCOUNTER — Telehealth: Payer: Self-pay | Admitting: Obstetrics and Gynecology

## 2023-12-12 ENCOUNTER — Other Ambulatory Visit: Payer: Self-pay

## 2023-12-12 ENCOUNTER — Ambulatory Visit: Payer: Medicare (Managed Care) | Admitting: Primary Care

## 2023-12-12 VITALS — HR 101 | Temp 95.9°F

## 2023-12-12 DIAGNOSIS — R030 Elevated blood-pressure reading, without diagnosis of hypertension: Secondary | ICD-10-CM

## 2023-12-12 DIAGNOSIS — Z780 Asymptomatic menopausal state: Secondary | ICD-10-CM

## 2023-12-12 DIAGNOSIS — D72819 Decreased white blood cell count, unspecified: Secondary | ICD-10-CM
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
## 2023-12-12 NOTE — Telephone Encounter (Signed)
 Patient has questions regarding Breast cancer (mother-biological) had breast cancer at the age of 35 -- would like to discuss use of Estradiol patch After Visit Summary stated to start Estradiol Patch on 12/11/2023 however when she picked up the prescriptions on 12/08/2023 she started all scripts.  The box with the Estradiol Patches did not state start 12/11/2023

## 2023-12-12 NOTE — Progress Notes (Signed)
 Internal Medicine Progress Note    Follow up leukopenia      HPI:   Danielle Frye returns for fu. She had sent home blood pressures that are now at goal.  She elected to hold off taking BP today as she was late and rushing.  Reviewed updated blood work from recommendation of Dr. Sharlyn Bologna in e consult.  Labs are unrevealing- stable low wbc and neutrophil count, rbc and platelets are normal.  She recently started hormone replacement therapy.  I did ask her to update Dr. Joseph Art as she shared with me that her biological mother had breast cancer to see if he wished to continue with current recommendation.      Patient's medications, allergies, past medical, surgical, social and family histories were reviewed, updated     Allergies:   Allergies as of 12/12/2023 - Up to Date 12/08/2023   Allergen Reaction Noted    Latex  10/30/2006    Sulfa antibiotics  10/30/2006        Home Meds:   Prior to Admission medications    Medication Sig Start Date End Date Taking? Authorizing Provider   estradiol (VIVELLE-DOT) 0.025 MG/24HR patch Apply 1 patch onto the skin twice a week. 12/11/23   Merita Norton, MD   progesterone (PROMETRIUM) 100 MG capsule Take one tablet each night. 12/08/23   Merita Norton, MD   estradiol (ESTRACE) 0.1 MG/GM vaginal cream If starting this medication, apply a pea size amount to external vagina nightly for one month, then three times a week. If used before, apply a pea size amount to the external vagina three times a week. 12/08/23   Merita Norton, MD   ascorbic acid 500 mg tablet Take 1 tablet (500 mg total) by mouth daily.    [provider]   Multiple Vitamins-Minerals (VITAMIN D3 COMPLETE PO) Take 2,000 Int'l Units/day by mouth daily.    [provider]   OMEGA 3-5-6-7-9 FATTY ACIDS PO Take by mouth. Patieint taking omega 7- 1000 mg daily    [provider]   Estradiol 10 MCG TABS Place 1 tablet vaginally twice a week 02/23/22   [provider]   estrogens conjugated (PREMARIN) 0.625  MG/GM vaginal cream Place vaginally twice a week    [provider]        Physical Exam:   Last set of vitals:  Vitals:    12/12/23 1012   Pulse: 101   Temp: 35.5 C (95.9 F)       General: NAD, well appearing, anxious due to being late      Assessment/Plan: Danielle Frye is here today for fu:     1. Leukopenia, unspecified type : stable, will review with Dr. Sharlyn Bologna   2. Post-menopausal : asked that she contact Dr. Joseph Art with updated family history of biological mother with history of breast ca.   3. Elevated systolic blood pressure reading without diagnosis of hypertension : at goal with home monitoring.             Medications/Orders:  Orders Placed This Encounter   No orders placed during this encounter.       RTO: as planned and prn.    Lannie Fields, MD on 12/12/2023 at 10:21 AM

## 2023-12-12 NOTE — Patient Instructions (Signed)
 Please share with Dr. Joseph Art that your biological mother had breast cancer at age 70.

## 2023-12-22 ENCOUNTER — Other Ambulatory Visit: Payer: Self-pay

## 2023-12-22 ENCOUNTER — Encounter: Payer: Self-pay | Admitting: Obstetrics and Gynecology

## 2023-12-22 ENCOUNTER — Ambulatory Visit: Payer: Medicare (Managed Care) | Admitting: Obstetrics and Gynecology

## 2023-12-22 VITALS — BP 138/72 | Ht 63.0 in | Wt 161.8 lb

## 2023-12-22 DIAGNOSIS — Z719 Counseling, unspecified: Secondary | ICD-10-CM

## 2023-12-22 NOTE — Progress Notes (Signed)
The day of the patient's visit I reviewed her entire electronic chart including medications, problems, and last visit clinical notes.    Danielle Frye is a 68 y.o. female here for discussion of breast cancer and estradiol .    Her specific menopause complaints are see below     Gynecologic History:  No LMP recorded. Patient is postmenopausal.     OB History    No obstetric history on file.         Patient Active Problem List   Diagnosis Code    Prehypertension R03.0    Asthmatic Bronchitis J45.909    Non-ulcer dyspepsia K30    UTI (urinary tract infection) N39.0    S/P colonoscopy Z98.890       Allergies   Allergen Reactions    Latex      Created by Conversion - Rash;     Sulfa Antibiotics      Created by Conversion - Hives;        Current Outpatient Medications   Medication    estradiol (VIVELLE-DOT) 0.025 MG/24HR patch    progesterone (PROMETRIUM) 100 MG capsule    estradiol (ESTRACE) 0.1 MG/GM vaginal cream    ascorbic acid 500 mg tablet    Multiple Vitamins-Minerals (VITAMIN D3 COMPLETE PO)    OMEGA 3-5-6-7-9 FATTY ACIDS PO    Estradiol 10 MCG TABS     No current facility-administered medications for this visit.       ROS:  Per HPI  CONSTITUTIONAL: Appetite good, no fevers, night sweats or weight loss  GI: No nausea/vomiting, abdominal pain, or change in bowel habits  GU: No dysuria, urgency or incontinence  PSYCH: No depression or anxiety    Objective:  BP 138/72 (BP Location: Right arm, Patient Position: Sitting, Cuff Size: adult)   Ht 1.6 m (5\' 3" )   Wt 73.4 kg (161 lb 12.8 oz)   BMI 28.66 kg/m      GENERAL: 68 y.o. year y/o female in NAD.    MENTAL STATUS: Alert, normal MS. Answers all questions appropriately.    This 68 year old female relatively new to this practice is completing the first 3 months on a low-dose estradiol patch.  Her laboratory test all demonstrate normal menopause.  Because she has been provided some misinformation about the fears of HRT and breast cancer and because she does have a  family history we spent the follow-up visit discussing the misinformation and the current understanding of the safety of low-dose estradiol patch and its relationship to breast biology.    At the end of the discussion the patient was satisfied that she understands why the misinformation was there and what the correct information has helped her to understand.

## 2024-01-03 ENCOUNTER — Encounter: Payer: Self-pay | Admitting: Primary Care

## 2024-01-03 DIAGNOSIS — D72819 Decreased white blood cell count, unspecified: Secondary | ICD-10-CM

## 2024-01-04 ENCOUNTER — Other Ambulatory Visit: Payer: Self-pay | Admitting: Primary Care

## 2024-01-04 ENCOUNTER — Encounter: Payer: Self-pay | Admitting: Primary Care

## 2024-01-04 DIAGNOSIS — D72819 Decreased white blood cell count, unspecified: Secondary | ICD-10-CM

## 2024-01-05 ENCOUNTER — Other Ambulatory Visit
Admission: RE | Admit: 2024-01-05 | Discharge: 2024-01-05 | Disposition: A | Discharge status: Home / Self Care | Payer: Medicare (Managed Care) | Source: Ambulatory Visit | Attending: Primary Care | Admitting: Primary Care

## 2024-01-05 DIAGNOSIS — D72819 Decreased white blood cell count, unspecified: Secondary | ICD-10-CM | POA: Insufficient documentation

## 2024-01-05 LAB — CBC AND DIFFERENTIAL
Baso # K/uL: 0 10*3/uL (ref 0.0–0.2)
Eos # K/uL: 0.1 10*3/uL (ref 0.0–0.5)
Hematocrit: 40 % (ref 34–49)
Hemoglobin: 13.2 g/dL (ref 11.2–16.0)
IMM Granulocytes #: 0 10*3/uL (ref 0.0–0.0)
IMM Granulocytes: 0.3 %
Lymph # K/uL: 1.3 10*3/uL (ref 1.0–5.0)
MCV: 94 fL (ref 75–100)
Mono # K/uL: 0.3 10*3/uL (ref 0.1–1.0)
Neut # K/uL: 1.8 10*3/uL (ref 1.5–6.5)
Nucl RBC # K/uL: 0 10*3/uL (ref 0.0–0.0)
Nucl RBC %: 0 /100{WBCs} (ref 0.0–0.2)
Platelets: 222 10*3/uL (ref 150–450)
RBC: 4.3 MIL/uL (ref 4.0–5.5)
RDW: 12.1 % (ref 0.0–15.0)
Seg Neut %: 51.5 %
WBC: 3.4 10*3/uL — ABNORMAL LOW (ref 3.5–11.0)

## 2024-01-09 ENCOUNTER — Telehealth: Payer: Self-pay | Admitting: Hematology

## 2024-01-10 NOTE — Telephone Encounter (Signed)
Writer spoke to patient explaining nurse navigator and Dr. Sharlyn Bologna are out of office this week but nurse navigator will review referral when she returns to the office. Patient verbalized understanding

## 2024-01-11 NOTE — Progress Notes (Signed)
Hematology New Patient Pathway     Danielle Frye  Age: 68 y.o.  DOB: [birth year]  MRN: [id number]  [address]  [place] [address]  Preferred language: ENGLISH      REGISTRATION:    Date of referral: 01/04/2024   Reason for Referral: Establish Care   Type of referral: Internal   Referring provider: PCP Dr. Tobin Chad   Reason for referral: Leukopenia   Assessment:   10/05/23 PCP OV note  10/15/23 eConsult  01/04/24 PCP Pt message CHART REVIEW     Labs: RESULTS REVIEW     Imaging: CHART REVIEW Image      Nurse Navigator encounter type: Chart Review      Current Outpatient Medications on File Prior to Visit:  estradiol (VIVELLE-DOT) 0.025 MG/24HR patch, Apply 1 patch onto the skin twice a week., Disp: 24 patch, Rfl: 0  progesterone (PROMETRIUM) 100 MG capsule, Take one tablet each night., Disp: 90 capsule, Rfl: 0  estradiol (ESTRACE) 0.1 MG/GM vaginal cream, If starting this medication, apply a pea size amount to external vagina nightly for one month, then three times a week. If used before, apply a pea size amount to the external vagina three times a week., Disp: 30 g, Rfl: 2  ascorbic acid 500 mg tablet, Take 1 tablet (500 mg total) by mouth daily., Disp: , Rfl:   Multiple Vitamins-Minerals (VITAMIN D3 COMPLETE PO), Take 2,000 Int'l Units/day by mouth daily., Disp: , Rfl:   OMEGA 3-5-6-7-9 FATTY ACIDS PO, Take by mouth. Patieint taking omega 7- 1000 mg daily, Disp: , Rfl:   Estradiol 10 MCG TABS, Place 1 tablet vaginally twice a week (Patient not taking: Reported on 12/22/2023), Disp: , Rfl:     No current facility-administered medications on file prior to visit.      No past medical history on file.    Time spent on this encounter (in minutes): 8

## 2024-02-15 ENCOUNTER — Other Ambulatory Visit: Payer: Self-pay | Admitting: Obstetrics and Gynecology

## 2024-02-15 NOTE — Telephone Encounter (Signed)
 Patient's pharmacy requesting a refill of estradiol 0.025 mg twice weekly patch. Last seen 12/22/23 for mediation check, has next appointment 03/01/24. Extension pended if ok, thank you

## 2024-02-21 ENCOUNTER — Other Ambulatory Visit: Payer: Self-pay | Admitting: Obstetrics and Gynecology

## 2024-02-21 DIAGNOSIS — Z01419 Encounter for gynecological examination (general) (routine) without abnormal findings: Secondary | ICD-10-CM

## 2024-03-01 ENCOUNTER — Ambulatory Visit: Payer: Medicare (Managed Care) | Admitting: Obstetrics and Gynecology

## 2024-03-01 ENCOUNTER — Other Ambulatory Visit: Payer: Self-pay

## 2024-03-01 VITALS — BP 136/68 | Ht 63.0 in | Wt 164.0 lb

## 2024-03-01 DIAGNOSIS — Z79890 Hormone replacement therapy (postmenopausal): Secondary | ICD-10-CM

## 2024-03-01 MED ORDER — ESTRADIOL 0.0375 MG/24HR TD PTTW *A*
1.0000 | MEDICATED_PATCH | TRANSDERMAL | 1 refills | Status: DC
Start: 2024-03-04 — End: 2024-07-23

## 2024-03-01 MED ORDER — PROGESTERONE 100 MG PO CAPS *I*
ORAL_CAPSULE | ORAL | 1 refills | Status: DC
Start: 2024-03-01 — End: 2024-07-29

## 2024-03-01 NOTE — Progress Notes (Signed)
 The day of the patient's visit I reviewed her entire electronic chart including medications, problems, and last visit clinical notes.    Danielle Frye is a 68 y.o. female here for HRT management .    Her specific menopause complaints are see below     Gynecologic History:  No LMP recorded. Patient is postmenopausal.     OB History    No obstetric history on file.         Patient Active Problem List   Diagnosis Code    Prehypertension R03.0    Asthmatic Bronchitis J45.909    Non-ulcer dyspepsia K30    UTI (urinary tract infection) N39.0    S/P colonoscopy Z98.890       Allergies[1]    Current Outpatient Medications   Medication    estradiol (VIVELLE-DOT) 0.025 MG/24HR patch    progesterone (PROMETRIUM) 100 MG capsule    estradiol (ESTRACE) 0.1 MG/GM vaginal cream    ascorbic acid 500 mg tablet    Multiple Vitamins-Minerals (VITAMIN D3 COMPLETE PO)    OMEGA 3-5-6-7-9 FATTY ACIDS PO    [START ON 03/04/2024] estradiol (VIVELLE-DOT) 0.0375 MG/24HR    progesterone (PROMETRIUM) 100 MG capsule    Estradiol 10 MCG TABS     No current facility-administered medications for this visit.       ROS:  Per HPI  CONSTITUTIONAL: Appetite good, no fevers, night sweats or weight loss  GI: No nausea/vomiting, abdominal pain, or change in bowel habits  GU: No dysuria, urgency or incontinence  PSYCH: No depression or anxiety    Objective:  BP 136/68 (BP Location: Right arm, Patient Position: Sitting, Cuff Size: adult)   Ht 1.6 m (5\' 3" )   Wt 74.4 kg (164 lb)   BMI 29.05 kg/m      GENERAL: 68 y.o. year y/o female in NAD.    MENTAL STATUS: 68 Alert, normal MS. Answers all questions appropriately.    This 68 year old female returns today where initially she was placed on a low-dose estradiol patch at 0.025 mg for 24 hours along with progesterone 100 mg each night and vaginal estrogen.  Patient has found some improvement particularly in sleep.  We reviewed other options.  I am therefore increasing her estradiol dose to 0.0375 mg for 24 hours  while keeping the progesterone at 100 mg each night.  In the third month of the new prescription if the patient is still struggling with symptoms she knows to come back into the office where we will consider increasing the dosage 1 more time of the estradiol.  We may at that point also want to talk about the role of testosterone for his benefit for energy and libido.    Patient has noticed some cramping at the uterine level from the estradiol.  While this is fairly normal, if this continues I suggested we might do an ultrasound just to evaluate the uterus and the ovaries.            [1]   Allergies  Allergen Reactions    Latex Other (See Comments)     Created by Conversion - Rash;    Sulfa Antibiotics Other (See Comments)     Created by Conversion - Hives;    cream - rash    Adhesive Tape Other (See Comments)

## 2024-03-05 ENCOUNTER — Telehealth: Payer: Self-pay

## 2024-03-05 NOTE — Telephone Encounter (Signed)
 Left VM reminding pt of NPV on 4/22

## 2024-03-11 ENCOUNTER — Other Ambulatory Visit: Payer: Self-pay

## 2024-03-12 ENCOUNTER — Other Ambulatory Visit: Payer: Self-pay

## 2024-03-12 ENCOUNTER — Other Ambulatory Visit: Payer: Self-pay | Admitting: Hematology

## 2024-03-12 ENCOUNTER — Ambulatory Visit: Payer: Medicare (Managed Care) | Attending: Hematology | Admitting: Hematology

## 2024-03-12 ENCOUNTER — Encounter: Payer: Self-pay | Admitting: Hematology

## 2024-03-12 VITALS — BP 140/75 | HR 88 | Temp 96.4°F | Resp 20 | Ht 62.99 in | Wt 162.0 lb

## 2024-03-12 DIAGNOSIS — D709 Neutropenia, unspecified: Secondary | ICD-10-CM | POA: Insufficient documentation

## 2024-03-12 DIAGNOSIS — D72819 Decreased white blood cell count, unspecified: Secondary | ICD-10-CM | POA: Insufficient documentation

## 2024-03-12 LAB — UNABLE TO PERFORM ADD-ON TESTING 1

## 2024-03-12 NOTE — Patient Instructions (Signed)
 Labs today

## 2024-03-12 NOTE — Progress Notes (Addendum)
 History of Present Illness  The patient is a 68 year old female who presents for evaluation of leukopenia. She is accompanied by her husband.    She is not sure of the duration of the CBC/diff abnormalities. She does not endorse history of recurrent infections, fevers, enlarged lymph nodes, or personal history of malignancy.    She reports overall good health, with the exception of a brief period of extreme fatigue lasting 2 to 3 days, which has since resolved. An episode of sudden exhaustion was noted while ascending stairs, a symptom not previously experienced. No respiratory issues such as shortness of breath or chest pain are reported. Additionally, there are no symptoms of hematuria, hematochezia, hemoptysis, or vomiting blood. No instances of excessive bruising without significant trauma are noted. Occasional headaches occur but are not regular. Appetite remains robust, and weight has been stable. Sleep has improved significantly after starting estradiol  and a patch prescribed by Dr. 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 She is now sleeping well and feels much more alert.    Mammogram screenings are up to date. A colonoscopy was performed by Dr. Linwood Ricker on 09/28/2022, with benign polyp removal and a follow-up recommended in five years. A bone scan is scheduled for this year, as it is done biennially. Occasional sinus pressure is experienced, as noted last summer, but no severe sinus infections or pneumonia requiring antibiotics or hospitalization have occurred. A pneumonia vaccine was received in the past, and another is being considered. Current medications include vitamin C, estradiol  (vaginal cream and patch), omega-7 for dry eye, progesterone , and D3.    FAMILY HISTORY  She was adopted, so she does not know her family history.       Objective   Blood pressure 140/75, pulse 88, temperature 35.8 C (96.4 F), temperature source Temporal,  resp. rate 20, height 160 cm (5' 2.99"), weight 73.5 kg (162 lb), SpO2 97%.  Physical Exam    Physical Exam  Constitutional:       Appearance: Normal appearance.   HENT:      Head: Normocephalic and atraumatic.   Eyes:      Extraocular Movements: Extraocular movements intact.      Conjunctiva/sclera: Conjunctivae normal.      Pupils: Pupils are equal, round, and reactive to light.   Cardiovascular:      Rate and Rhythm: Normal rate and regular rhythm.      Pulses: Normal pulses.      Heart sounds: Normal heart sounds.   Pulmonary:      Effort: Pulmonary effort is normal.      Breath sounds: Normal breath sounds.   Abdominal:      General: Abdomen is flat.      Palpations: Abdomen is soft.   Musculoskeletal:         General: Normal range of motion.      Cervical back: Normal range of motion and neck supple.   Skin:     General: Skin is warm.   Neurological:      General: No focal deficit present.      Mental Status: She is alert and oriented to person, place, and time.   Psychiatric:         Behavior: Behavior normal.         Thought Content: Thought content normal.       Results      Lab results: 01/05/24  1055 09/21/23  2536  WBC 3.4* 2.7*   Hemoglobin 13.2 12.2   Hematocrit 40 38   RBC 4.3 4.0   Platelets 222 212   Neut # K/uL 1.8 1.0*   Lymph # K/uL 1.3 1.3   Mono # K/uL 0.3 0.3   Eos # K/uL 0.1 0.1   Baso # K/uL 0.0 0.0   Seg Neut % 51.5 36.4   MCV 94 95       Component      Latest Ref Rng 08/05/2013 12/05/2023   HBV S Ag      NEG  NEG  NEG    HBV S Ab NEG  NEG    HBV S Ab Quant      mIU/mL 0.00  0.30    HBV Core Ab      NEG  NEG  NEG    HBV Interp see below  see below    Hep C Ab      NEG   NEG    HIV 1&2 ANTIGEN/ANTIBODY      Nonreactive   Nonreactive      Assessment & Plan  1. Leukopenia.  I personally reviewed her historical CBC/diff dating back to 2015, and this is notable for consistently low leukocyte count due to low neutrophil count, with periodic fluctuations. The lowest recorded ANC was 1000/uL, but it  has generally remained above this level.     The etiology of the chronic leukopenia with intermittent neutropenia could be multifactorial, potentially involving autoimmune neutropenia, cyclic neutropenia, or Duffy antigen associated neutropenia. It is also plausible that the condition is benign, given its chronic nature and relative stability over the past decade. At present, there is no need for intervention as she is asymptomatic and not experiencing recurrent infections requiring antibiotic treatment or hospitalization.    I recall that HIV and hepatitis serologies have been negative.     A comprehensive set of laboratory tests will be conducted today to assess her micronutrient levels including iron, B12, and folate levels. Additional specialized testing will be performed to investigate the potential causes of her leukopenia. She has been advised to receive her pneumonia vaccine following the completion of her lab work. If her condition deteriorates or she becomes symptomatic, further intervention may be necessary, including the consideration of a bone marrow biopsy.      2. Post-menopausal symptoms.  She is currently on hormone replacement therapy (HRT) with estradiol  and progesterone , which has significantly improved her sleep and overall alertness. Despite concerns about HRT and its potential risks, she has decided to continue the treatment due to its benefits on her sleep and quality of life. The risks of HRT include an increased risk of breast cancer recurrence and cardiovascular issues, while the benefits include improved sleep and reduced menopausal symptoms. Alternatives to HRT include non-hormonal treatments for menopausal symptoms and sleep aids.     Follow-up  The patient will follow up in 1 year or sooner if necessary.    I personally spent 62 minutes on the calendar day of the encounter, including pre and post visit work.    Agustina Hosteller, MD  [facility]  [address] [facility] [address]   [place]  [address]  Phone: [phone] I Fax: [phone]    Orders Placed This Encounter    Paroxysmal nocturnal hemoglobinuria    Iron    Folate    Ferritin    Vitamin B12  Transferrin    TIBC    Duplicate slide    TSH    Lactate dehydrogenase    IgG    IGM    IgA    Protein electrophoresis, serum    Immunofixation electrophoresis    Antinuclear antibody screen    Duffy Antigen Typing For Duffy-Null Associated Neutropenia       Cc: Weyman Hammond, MD  [address]  [address]  [place],  [place] [address]      Author: Agustina Hosteller, MD  Note signed: 03/12/2024

## 2024-03-15 ENCOUNTER — Encounter: Payer: Self-pay | Admitting: Hematology

## 2024-04-22 ENCOUNTER — Encounter: Payer: Self-pay | Admitting: Gastroenterology

## 2024-04-22 ENCOUNTER — Ambulatory Visit: Payer: Medicare (Managed Care)

## 2024-04-22 ENCOUNTER — Encounter: Payer: Self-pay | Admitting: Family Medicine

## 2024-04-22 ENCOUNTER — Encounter: Payer: Self-pay | Admitting: Primary Care

## 2024-04-22 ENCOUNTER — Other Ambulatory Visit: Payer: Self-pay

## 2024-04-22 ENCOUNTER — Ambulatory Visit: Payer: Medicare (Managed Care) | Attending: Family Medicine | Admitting: Family Medicine

## 2024-04-22 ENCOUNTER — Other Ambulatory Visit
Admission: RE | Admit: 2024-04-22 | Discharge: 2024-04-22 | Disposition: A | Discharge status: Home / Self Care | Payer: Medicare (Managed Care) | Source: Ambulatory Visit

## 2024-04-22 ENCOUNTER — Ambulatory Visit
Admission: RE | Admit: 2024-04-22 | Discharge: 2024-04-22 | Disposition: A | Discharge status: Home / Self Care | Payer: Medicare (Managed Care) | Source: Ambulatory Visit

## 2024-04-22 VITALS — BP 139/70 | HR 96 | Temp 98.2°F | Resp 18

## 2024-04-22 DIAGNOSIS — W109XXA Fall (on) (from) unspecified stairs and steps, initial encounter: Secondary | ICD-10-CM | POA: Insufficient documentation

## 2024-04-22 DIAGNOSIS — D709 Neutropenia, unspecified: Secondary | ICD-10-CM | POA: Insufficient documentation

## 2024-04-22 DIAGNOSIS — I444 Left anterior fascicular block: Secondary | ICD-10-CM

## 2024-04-22 DIAGNOSIS — Z789 Other specified health status: Secondary | ICD-10-CM

## 2024-04-22 DIAGNOSIS — R0781 Pleurodynia: Secondary | ICD-10-CM | POA: Insufficient documentation

## 2024-04-22 DIAGNOSIS — M549 Dorsalgia, unspecified: Secondary | ICD-10-CM

## 2024-04-22 DIAGNOSIS — D72819 Decreased white blood cell count, unspecified: Secondary | ICD-10-CM

## 2024-04-22 DIAGNOSIS — W19XXXA Unspecified fall, initial encounter: Secondary | ICD-10-CM

## 2024-04-22 DIAGNOSIS — M533 Sacrococcygeal disorders, not elsewhere classified: Secondary | ICD-10-CM

## 2024-04-22 DIAGNOSIS — R9431 Abnormal electrocardiogram [ECG] [EKG]: Secondary | ICD-10-CM

## 2024-04-22 DIAGNOSIS — R0789 Other chest pain: Secondary | ICD-10-CM | POA: Insufficient documentation

## 2024-04-22 DIAGNOSIS — R0602 Shortness of breath: Secondary | ICD-10-CM

## 2024-04-22 DIAGNOSIS — R072 Precordial pain: Secondary | ICD-10-CM

## 2024-04-22 LAB — TIBC
Iron: 91 ug/dL (ref 34–165)
TIBC: 319 ug/dL (ref 250–450)
Transferrin Saturation: 29 % (ref 15–50)

## 2024-04-22 LAB — POCT URINALYSIS DIPSTICK
Bilirubin,Ur: NEGATIVE
Glucose,UA POCT: NORMAL mg/dL
Ketones,UA POCT: NEGATIVE mg/dL
Leuk Esterase,UA POCT: NEGATIVE
Lot #: 81534103
Nitrite,UA POCT: NEGATIVE
PH,UA POCT: 5 (ref 5–8)
Protein,UA POCT: NEGATIVE mg/dL
Specific gravity,UA POCT: 1.03 (ref 1.002–1.030)
Urobilinogen,UA: NORMAL mg/dL

## 2024-04-22 LAB — DUPLICATE SLIDE: Slide Sent To: 125

## 2024-04-22 LAB — IGA: IgA: 211 mg/dL (ref 70–400)

## 2024-04-22 LAB — DUFFY ANTIGEN TYPING FOR DUFFY-NULL ASSOCIATED NEUTROPENIA

## 2024-04-22 LAB — TRANSFERRIN: Transferrin: 236 mg/dL (ref 200–360)

## 2024-04-22 LAB — LACTATE DEHYDROGENASE: LD: 147 U/L (ref 118–225)

## 2024-04-22 LAB — PT TYPE, 'FYA' ANTIGEN: Pt Type, 'FYA' Antigen: NEGATIVE

## 2024-04-22 LAB — PAROXYSMAL NOCTURNAL HEMOGLOBINURIA

## 2024-04-22 LAB — FERRITIN: Ferritin: 89 ng/mL (ref 10–120)

## 2024-04-22 LAB — TSH: TSH: 2.32 u[IU]/mL (ref 0.27–4.20)

## 2024-04-22 LAB — IGM: IgM: 108 mg/dL (ref 40–230)

## 2024-04-22 LAB — FOLATE: Folate: 11.8 ng/mL (ref >=4.6)

## 2024-04-22 LAB — VITAMIN B12: Vitamin B12: 317 pg/mL (ref 232–1245)

## 2024-04-22 LAB — PT TYPE, FYB ANTIGEN: PT Type, Fyb antigen: POSITIVE

## 2024-04-22 LAB — IGG: IgG: 1029 mg/dL (ref 700–1600)

## 2024-04-22 NOTE — [facility] Provider Note (Signed)
 History     Chief Complaint   Patient presents with   . Fall     Pt reports she slipped and fell down the stairs 6 weeks ago. Reports she landed on her back and hit the back of her head on the stair, sliding down the staircase and hitting her tailbone on the wood floor. Denies LOC. Pt reports ongoing pain to mid/upper back, tailbone, and sternum. Pt reports discomfort wearing bra due to sternum pain and difficulty sleeping. Pt applying heat and ice for relief.      Patient is a 68 year old female with past medical history of dyspepsia, chronic low white blood cell count presently being evaluated by oncology, recent start of estrogen therapy and osteopenia who presents with persisting consistent 8 out of 10 anterior chest wall pain, "deep pain under my shoulder blades", thoracic back pain and coccyx pain status post significant fall down 6 stairs 6 weeks ago.  Patient landed and hit the back of her head on the stair.  She also hit her thoracic spine.  During the fall she felt that sharing excruciating pain substernally and subxiphoid.  She also fell onto her buttocks and has had persisting pain of her coccyx however this is milder as compared to her other pain.  She has been unable to do her usual activities and also is unable to wear a bra due to sternal/xiphoid pain.  Patient is avoiding ibuprofen and Tylenol due to her ongoing hematology evaluation at the direction of her oncologist.  Patient denied loss of consciousness.  Mild nausea which spontaneously and quickly resolved following the fall no vomiting no confusion imbalance or difficulty speaking patient is applying heat and ice without significant relief of symptoms she denies shortness of breath.  She does report very brief sporadic episodes of needing to take a deep breath fluttering of her chest (points to diaphragm).  Fall (Pt reports she slipped and fell down the stairs 6 weeks ago. Reports she landed on her back and hit the back of her head on the  stair, sliding down the staircase and hitting her tailbone on the wood floor. Denies LOC. Pt reports ongoing pain to mid/upper back, tailbone, and sternum. Pt reports discomfort wearing bra due to sternum pain and difficulty sleeping. Pt applying heat and ice for relief.  Patient is avoiding ibuprofen and Tylenol due to ongoing evaluation for chronic neutropenia      Medical/Surgical/Family History     History reviewed. No pertinent past medical history.     Patient Active Problem List   Diagnosis Code   . Prehypertension R03.0   . Asthmatic Bronchitis J45.909   . Non-ulcer dyspepsia K30   . UTI (urinary tract infection) N39.0   . S/P colonoscopy U98.119            Past Surgical History:   Procedure Laterality Date   . KNEE SURGERY      Knee Surgery Conversion Data    . TUBAL LIGATION      Tubal Ligation Conversion Data      Family History   Problem Relation Age of Onset   . Breast cancer Mother    . Conversion Other         20110506^Breast Cancer^V16.3^Active^   . Conversion Other         [id number]^OZHYQM Syndrome^436^Active^   . Conversion Other         20110506^Hypertension^401.9^Active^   . Conversion Other         20110506^Adopted^^Active^  Social History[1]  Living Situation       Questions Responses    Patient lives with     Homeless     Caregiver for other family member     External Services     Employment     Domestic Violence Risk                   Review of Systems   Review of Systems    Physical Exam   Vitals     First Recorded  ,    .      Physical Exam  Vitals and nursing note reviewed.   Constitutional:       General: She is not in acute distress.     Appearance: She is well-developed.      Comments: Mildly uncomfortable appearing   HENT:      Head: Normocephalic and atraumatic.   Eyes:      Conjunctiva/sclera: Conjunctivae normal.   Neck:      Vascular: No carotid bruit.   Cardiovascular:      Rate and Rhythm: Normal rate and regular rhythm.      Heart sounds: No murmur heard.     No friction  rub.   Pulmonary:      Effort: Pulmonary effort is normal.      Breath sounds: Normal breath sounds. No wheezing or rhonchi.   Chest:          Comments: Tenderness to palpation no crepitus no palpable defect  Abdominal:      General: There is no distension.      Palpations: Abdomen is soft.      Tenderness: There is no abdominal tenderness. There is no right CVA tenderness, left CVA tenderness, guarding or rebound.   Musculoskeletal:      Cervical back: Normal range of motion and neck supple. No rigidity or tenderness.      Thoracic back: Tenderness and bony tenderness present. No deformity. Normal range of motion.      Lumbar back: No swelling, tenderness or bony tenderness.        Back:       Comments: Mild tenderness to palpation remaining spinal exam without tenderness  No coccyx tenderness to palpation   Lymphadenopathy:      Cervical: No cervical adenopathy.   Skin:     General: Skin is warm and dry.   Neurological:      Mental Status: She is alert and oriented to person, place, and time.   Psychiatric:         Behavior: Behavior normal.         Thought Content: Thought content normal.         Judgment: Judgment normal.        Medical Decision Making   Medical Decision Making  Assessment:    Patient is a 68 year old female with past medical history of dyspepsia, chronic low white blood cell count presently being evaluated by oncology, recent start of estrogen therapy and osteopenia who presents with persisting consistent 8 out of 10 anterior chest wall pain, "deep pain under my shoulder blades", thoracic back pain and coccyx pain status post significant fall down 6 stairs 6 weeks ago.  Patient landed and hit the back of her head on the stair.  She also hit her thoracic spine.  During the fall she felt that sharing excruciating pain substernally and subxiphoid.  She also fell onto her buttocks and has had persisting pain of her  coccyx however this is milder as compared to her other pain.  She has been unable to  do her usual activities and also is unable to wear a bra due to sternal/xiphoid pain.  Patient is avoiding ibuprofen and Tylenol due to her ongoing hematology evaluation at the direction of her oncologist.  Patient denied loss of consciousness.  Mild nausea which spontaneously and quickly resolved following the fall no vomiting no confusion imbalance or difficulty speaking patient is applying heat and ice without significant relief of symptoms she denies shortness of breath.  She does report very brief sporadic episodes of needing to take a deep breath fluttering of her chest (points to diaphragm).  Fall (Pt reports she slipped and fell down the stairs 6 weeks ago. Reports she landed on her back and hit the back of her head on the stair, sliding down the staircase and hitting her tailbone on the wood floor. Denies LOC. Pt reports ongoing pain to mid/upper back, tailbone, and sternum. Pt reports discomfort wearing bra due to sternum pain and difficulty sleeping. Pt applying heat and ice for relief.  Patient is avoiding ibuprofen and Tylenol due to ongoing evaluation for chronic neutropenia    Exam as detailed above    Differential diagnosis:    Sternal or rib cage fracture  Vertebral compression fracture +/- bulging disc  Lung contusion or other abnormality in the context of neutropenia      Plan and Results:    Discussed differential diagnosis.  Shared decision making regarding further evaluation options including ED evaluation tonight versus contacting PCP in the morning with monitoring of subacute/chronic symptoms overnight.    Contact your PCP office and schedule an appointment and/or discuss follow-up for your pain status post significant fall and your T5 vertebra compression fracture.  Contact your oncology/hematology office and inquire about using Tylenol and/or ibuprofen as needed for pain.  Be careful when using heating pad to avoid burn.  If your symptoms acutely worsen, call 911 and proceed to the  emergency department by ambulance for further evaluation immediately.  ED record message sent to PCP informing of patient's urgent care evaluation        EKG Interpretation:    Normal sinus rhythm heart rate 84 no acute abnormalities unchanged from previous EKG  Normal sinus rhythm and No ischemic changes      Independent Review of: XRays this encounter    Diagnosis and Disposition:       Return precautions discussed and provided on AVS.    Discharge Medications            Final Diagnosis  No diagnosis found.      Nicosha Struve M Alacia Rehmann, MD          Author:  Saesha Llerenas M Masen Salvas, MD               [1]  Social History  Tobacco Use   . Smoking status: Former     Types: Cigarettes     Start date: 05/17/1992   . Smokeless tobacco: Never   Substance Use Topics   . Alcohol use: Yes     Comment: 2 glasses of wine or less a week

## 2024-04-22 NOTE — Patient Instructions (Addendum)
 Contact your PCP office and schedule an appointment and/or discuss follow-up for your pain status post significant fall and your T5 vertebra compression fracture.  Contact your oncology/hematology office and inquire about using Tylenol and/or ibuprofen as needed for pain.  Be careful when using heating pad to avoid burn.  If your symptoms acutely worsen, call 911 and proceed to the emergency department by ambulance for further evaluation immediately.

## 2024-04-23 ENCOUNTER — Encounter: Payer: Self-pay | Admitting: Primary Care

## 2024-04-23 LAB — ANTINUCLEAR ANTIBODY SCREEN: ANA Screen: NEGATIVE

## 2024-04-23 LAB — AEROBIC CULTURE: Aerobic Culture: 0

## 2024-04-24 LAB — PROTEIN ELECTROPHORESIS, SERUM
A/G Ratio: 1.4 (ref 0.9–1.8)
Albumin: 3.6 g/dL (ref 3.5–5.1)
Alpha 1: 0.2 g/dL (ref 0.2–0.4)
Alpha 2: 0.7 g/dL (ref 0.4–0.9)
Beta: 0.8 g/dL (ref 0.5–1.0)
Gamma: 1 g/dL (ref 0.7–1.4)
Interp,PE: NORMAL
Total Protein: 6.3 g/dL (ref 6.3–7.7)

## 2024-04-24 LAB — PE ELECT,REVIEW

## 2024-04-24 LAB — IMMUNOFIXATION ELECTROPHORESIS: Immunofixation,Serum: NORMAL

## 2024-04-24 LAB — IMMUNOFIX,SERUM REVIEW

## 2024-04-25 LAB — SURGICAL PATHOLOGY

## 2024-04-29 ENCOUNTER — Ambulatory Visit
Admission: RE | Admit: 2024-04-29 | Discharge: 2024-04-29 | Disposition: A | Discharge status: Home / Self Care | Payer: Medicare (Managed Care) | Source: Ambulatory Visit | Attending: Primary Care

## 2024-04-29 ENCOUNTER — Encounter: Payer: Self-pay | Admitting: Primary Care

## 2024-04-29 ENCOUNTER — Ambulatory Visit: Payer: Medicare (Managed Care) | Attending: Primary Care | Admitting: Primary Care

## 2024-04-29 ENCOUNTER — Other Ambulatory Visit: Payer: Self-pay

## 2024-04-29 VITALS — BP 126/70 | HR 89 | Temp 96.7°F | Wt 164.8 lb

## 2024-04-29 DIAGNOSIS — M25752 Osteophyte, left hip: Secondary | ICD-10-CM

## 2024-04-29 DIAGNOSIS — M25552 Pain in left hip: Secondary | ICD-10-CM | POA: Insufficient documentation

## 2024-04-29 DIAGNOSIS — M4854XA Collapsed vertebra, not elsewhere classified, thoracic region, initial encounter for fracture: Secondary | ICD-10-CM | POA: Insufficient documentation

## 2024-04-29 DIAGNOSIS — D72819 Decreased white blood cell count, unspecified: Secondary | ICD-10-CM | POA: Insufficient documentation

## 2024-04-29 NOTE — Patient Instructions (Signed)
 XR left hip    Two referrals:   Dr. Freda Jacobson for T5 compression fracture    Orthopedics, non operative for left hip pain

## 2024-04-29 NOTE — Progress Notes (Signed)
 Internal Medicine Progress Note    Back and hip pain      HPI:    Left hip pain- began in the winter after bending her leg underneath her- extremely painful- could not sleep on it.  Still walking with a limp and painful when walking prolonged period and inclines.   Danielle Frye fell at the end of April- she fell backwards on her back down the stairs ultimately hitting her back and back of the head.  No pain initially.  Tailbone was hurting but she was able to drive.  When she tried to get out of the car she had pain 'all over'.  Felt it was muscular at first- cough, sneeze, rolling over, sitting could be very painful.  First three weeks she was not improving and then went to urgent care.  No LOC. No incontinence.    Fu low wbc count- now seeing Danielle Frye      Patient's medications, allergies, past medical, surgical, social and family histories were reviewed, updated     Allergies:   Allergies as of 04/29/2024 - Up to Date 04/22/2024   Allergen Reaction Noted    Latex Other (See Comments) 10/30/2006    Sulfa antibiotics Other (See Comments) 10/30/2006    Adhesive tape Other (See Comments) 03/01/2024        Home Meds:   Prior to Admission medications   Medication Sig Start Date End Date Taking? Authorizing Provider   estradiol  (VIVELLE -DOT) 0.0375 MG/24HR Apply 1 patch onto the skin twice a week. 03/04/24   Danielle Batters, MD   progesterone  (PROMETRIUM ) 100 MG capsule Take one tablet each night. 03/01/24   Danielle Batters, MD   estradiol  (VIVELLE -DOT) 0.025 MG/24HR patch APPLY 1 PATCH ONTO SKIN TWICE A WEEK 02/15/24   Danielle Batters, MD   estradiol  (ESTRACE ) 0.1 MG/GM vaginal cream If starting this medication, apply a pea size amount to external vagina nightly for one month, then three times a week. If used before, apply a pea size amount to the external vagina three times a week. 12/08/23   Danielle Batters, MD   ascorbic acid 500 mg tablet Take 1 tablet (500 mg total) by mouth daily.    [provider]   Multiple  Vitamins-Minerals (VITAMIN D3 COMPLETE PO) Take 2,000 Int'l Units/day by mouth daily.    [provider]   OMEGA 3-5-6-7-9 FATTY ACIDS PO Take by mouth. Patieint taking omega 7- 1000 mg daily    [provider]        Physical Exam:   Last set of vitals:  Vitals:    04/29/24 0830   BP: 126/70   Pulse: 89   Temp: 35.9 C (96.7 F)   Weight: 74.8 kg (164 lb 12.8 oz)       General: NAD, appears in her usoh  Back: nontender over the spinous processes  Left hip with intact flex/ext, tender over the lateral aspect and ASIS, pain with internal rotation        Assessment/Plan: Danielle Frye is here today for followup after fall with recent diagnosis at urgent care of compression fracture.  In her workup for leukocytosis she had a recent SPEP that was normal.  Ongoing pain in her back and left hip.    1. Left hip pain : check left hip XR, non op ortho referral   2. Compression fracture of thoracic spine, non-traumatic : referral to physical medicine and rehab- ? Need for further imaging given ongoing pain   3. Leukopenia,  unspecified type : cont to fu with Danielle Frye as recommended             Medications/Orders:  Orders Placed This Encounter    * Hip LEFT AP and Lateral views    AMB REFERRAL TO [facility]    AMB REFERRAL TO PHYSICAL MEDICINE AND RE[facility]ABIL[facility]       RTO: as planned and as needed.    Darcel Early, MD on 04/29/2024 at 9:00 AM

## 2024-04-30 ENCOUNTER — Ambulatory Visit: Payer: Self-pay | Admitting: Primary Care

## 2024-05-02 LAB — EKG 12-LEAD
P: 34 deg
PR: 119 ms
QRS: -34 deg
QRSD: 93 ms
QT: 360 ms
QTc: 434 ms
Rate: 87 {beats}/min
T: -46 deg

## 2024-05-17 ENCOUNTER — Ambulatory Visit: Payer: Medicare (Managed Care) | Admitting: Orthopaedic Surgery

## 2024-05-17 ENCOUNTER — Other Ambulatory Visit: Payer: Self-pay

## 2024-05-17 ENCOUNTER — Encounter: Payer: Self-pay | Admitting: Orthopaedic Surgery

## 2024-05-17 ENCOUNTER — Ambulatory Visit
Admission: RE | Admit: 2024-05-17 | Discharge: 2024-05-17 | Disposition: A | Discharge status: Home / Self Care | Payer: Medicare (Managed Care) | Source: Ambulatory Visit

## 2024-05-17 VITALS — Ht 62.0 in | Wt 164.0 lb

## 2024-05-17 DIAGNOSIS — M7062 Trochanteric bursitis, left hip: Secondary | ICD-10-CM

## 2024-05-17 DIAGNOSIS — M25552 Pain in left hip: Secondary | ICD-10-CM

## 2024-05-17 DIAGNOSIS — M1612 Unilateral primary osteoarthritis, left hip: Secondary | ICD-10-CM

## 2024-05-17 NOTE — Progress Notes (Signed)
 Orthopaedic Surgery Clinic Note       Name: Danielle Frye  MRN: [id number]  Date: 05/17/2024     CC:   Chief Complaint   Patient presents with    Left Hip - New Patient Visit         History       Danielle Frye is a 68 y.o. that is being seen as a consult request from Dr. Gretta, Inocente Last, MD for evaluation of   Chief Complaint   Patient presents with    Left Hip - New Patient Visit     Patient is here for left-sided hip pain.  Patient states that she has pain over the posterior lateral aspect of her hip.  States that she has some groin pain.  Patient states that she is limping, she has difficulty going up and down the incline.  She states that this started in February.  She has difficulty walking.  Has not been taking any medications because she is being low white blood cell count.      Past medical history, past surgical history, medications, allergies, family history, social history, and review of systems were reviewed today and have been documented separately in this encounter.        Physical Exam       General: She is in no acute distress.  She  is alert and oriented x 3.   Respiratory: adequate effort  Cardiovascular: extremities perfused  Abdomen: non distended  Skin: no rashes    The left lower extremity demonstrates:    Musculoskeletal: Focused examination of the lumbar spine and affected hip(s)/knee(s) is performed.  Gait: smooth, symmetric gait, without assistive device.     Hip:  Left hip:   Skin: no skin lesions, rashes, or discoloration  Prior scars: none   ROM: Flexion: 105, Internal rotation: 15 External rotation: 20 Abduction: 30    There is pain with range of motion of the hip, particularly flexion and internal rotation.  Special testing: Stinchfield maneuver: positive, tenderness to palpation over the lateral trochanter deep within the left buttock  Neuro:     Neuro:  Motor: quadriceps, dorsiflexion, plantar flexion, and EHL intact  Sensation: sensation is intact to light touch in  saphenous/sural/tibial/deep and superficial peroneal nerve distributions   Circulation: Dorsalis pedis pulses palpable and symmetric bilaterally, toes demonstrate brisk capillary refill.   Peripheral vascular skin changes/ulcers: No      Labs         Lab results: 09/21/23  0728   Sodium 138   Potassium 4.4   Chloride 101   CO2 26   UN 8   Creatinine 0.79   Glucose 92   Calcium 9.3            Lab results: 01/05/24  1055   WBC 3.4*   Hemoglobin 13.2   Hematocrit 40   RBC 4.3   Platelets 222        Hemoglobin A1C   Date Value Ref Range Status   09/21/2023 5.7 (H) % Final     Comment:     Ref Range <=5.6  HbA1c values of 5.7-6.4% indicate an increased risk for developing  diabetes mellitus.  HbA1c values greater than or equal to 6.5% are diagnostic of  diabetes mellitus.  For diagnosis of diabetes in individuals without unequivocal  hyperglycemia, results should be confirmed by repeat testing.               Imaging  XRAY: 05/17/2024 imaging was personally reviewed and interpreted and demonstrates very minor osteoarthritis of the left hip with small loss of cartilage height the superolateral acetabulum and superior lateral osteophyte formation subchondral sclerosis    MRI:      Assessment/Plan       Danielle Frye is a 68 y.o. female with:        ICD-10-CM ICD-9-CM    1. Left hip pain  M25.552 719.45 Hip LEFT AP and Lateral with pelvis          I discussed the pathophysiology of hip osteoarthritis with the patient today. We discussed that this is a degenerative condition that results in progressive loss of articular cartilage, which is what normally provides the smooth articulating surface for movement of the hip joint. Various risk modifiable and non-modifiable risk factors can contribute to osteoarthritis including age, trauma, genetics and occupation. Based on patient's exam, imaging reviewed today and treatments tried patient would like to treat this conservatively.. We did briefly discuss surgical options related  to total replacement including the risks and benefits and patient would like to defer until having tried the treatment plan below.    We have agreed to proceed as follows:    - Conservative management with topical voltaren gel, capaicin cream or tiger balm as directed per medication directions for the anti-inflammatory/pain relief benefits.    - A physical therapy script for aquatic or land PT was placed for this patient today to work on general ROM, strength and conditioning of quadriceps, hamstrings and hip flexors.    The patient expressed appreciation and understanding with the above stated plan of care.     All questions invited and answered to patient's satisfaction.     Follow up   As needed            Danielle Creed DONALD Daris Aristizabal, MD  Orthopaedic Surgery  05/17/2024  1:33 PM

## 2024-06-05 ENCOUNTER — Encounter: Payer: Self-pay | Admitting: Physical Medicine and Rehabilitation

## 2024-06-05 ENCOUNTER — Ambulatory Visit: Payer: Medicare (Managed Care) | Admitting: Physical Medicine and Rehabilitation

## 2024-06-05 ENCOUNTER — Other Ambulatory Visit: Payer: Self-pay

## 2024-06-05 VITALS — BP 138/90 | HR 97 | Ht 62.0 in | Wt 164.0 lb

## 2024-06-05 DIAGNOSIS — M5414 Radiculopathy, thoracic region: Secondary | ICD-10-CM

## 2024-06-05 DIAGNOSIS — M546 Pain in thoracic spine: Secondary | ICD-10-CM

## 2024-06-05 NOTE — Progress Notes (Signed)
 History of Present Illness:  Back Pain  Episode onset: 03/2024 after falling. The problem occurs constantly. Pain location: midline mid thoracic region, left paramidline mid thoracic region. The pain is at a severity of 4/10.       Patient Provided Narrative History:  History of Present Illness        Current pain medications include: ibuprofen prn;    Physical Therapy/Home Exercise Program:  - not yet attempted    Desired Functional Activity Limited by Pain (behavioral modifications):  - ADLs are painful        Past Medical History[1]        Objective:  Physical Exam  Vitals reviewed.   Constitutional:       Appearance: She is well-developed.   HENT:      Head: Normocephalic.   Pulmonary:      Effort: Pulmonary effort is normal.   Skin:     General: Skin is dry.   Neurological:      Mental Status: She is alert.       Neurological Exam  Mental Status  Alert.    Motor   Strength is 5/5 in all four extremities except as noted.              Tobacco Use: Medium Risk (06/05/2024)    Patient History     Smoking Tobacco Use: Former     Smokeless Tobacco Use: Never     Passive Exposure: Not on file         Lab Results   Component Value Date    HA1C 5.7 (H) 09/21/2023    CREAT 0.79 09/21/2023    CREAT 0.84 03/03/2022    AST 23 09/21/2023    ALT 12 09/21/2023    ALB 3.9 09/21/2023          Narrative Results Review:  Results        Assessment/Plan   Narrative Assessment//Plan:  Assessment & Plan  T5 compression fracture and likely T56 disc component     1. Continue current analgesic regimen.  2. Continue home exercise program. The patient was provided with a cervical spine-specific stabilization exercise program handout.  3. The patient was prescribed spine focused physical therapy to improve spine biomechanics and reduce strain on the spine during recreational and vocational activities.  4. If no relief, the patient will undergo thoracic spine MRI to evaluate for morphologic abnormalities.  5. He may then be a candidate for a  T6-7 interlaminar epidural steroid injection if evidence of discogenic pathology.  6. F/u after PT trial.           No problem-specific Assessment & Plan notes found for this encounter.                   Follow up  recommended: No follow-ups on file.     Patient verbalized understanding of the above instructions.     Current Outpatient Medications   Medication    estradiol  (VIVELLE -DOT) 0.0375 MG/24HR    progesterone  (PROMETRIUM ) 100 MG capsule    estradiol  (ESTRACE ) 0.1 MG/GM vaginal cream    ascorbic acid 500 mg tablet    Multiple Vitamins-Minerals (VITAMIN D3 COMPLETE PO)    OMEGA 3-5-6-7-9 FATTY ACIDS PO     No current facility-administered medications for this visit.       Akaash Vandewater El-Hassan, MD  06/05/2024     [place], MD  Electronically signed on 06/05/2024 at 2:33 PM.         [  1] No past medical history on file.

## 2024-06-13 ENCOUNTER — Ambulatory Visit: Payer: Medicare (Managed Care) | Admitting: Orthopaedic Surgery

## 2024-06-19 ENCOUNTER — Other Ambulatory Visit: Payer: Self-pay

## 2024-06-20 ENCOUNTER — Ambulatory Visit
Payer: Medicare (Managed Care) | Attending: Orthopaedic Surgery | Admitting: Rehabilitative and Restorative Service Providers"

## 2024-06-20 DIAGNOSIS — M1612 Unilateral primary osteoarthritis, left hip: Secondary | ICD-10-CM | POA: Insufficient documentation

## 2024-06-20 DIAGNOSIS — M549 Dorsalgia, unspecified: Secondary | ICD-10-CM | POA: Insufficient documentation

## 2024-06-20 NOTE — Progress Notes (Signed)
 Sent via: eRecord EMR INBASKET    Physician attestation for [facility] Plan of Care: Physician/NP/PA:  Esmeralda Netter  Per signature, I have reviewed and agree with the documented plan of care.    X____________________________________________________   DATE: _____________    Please sign this Medicare plan of care for outpatient therapy treatment as required by Medicare. If you have any questions, please contact us  at [phone]. We appreciate your prompt attention to this request.    Sincerely,   [facility]         [facility] ORTHO SPORTS+SPINE REHABILITATION EVALUATION      Diagnosis: left hip arthritis; back pain  Onset date:  Chronic  Date of surgery: NA    History    Danielle Frye is a 68 y.o. female who is present today for back, left hip care.   Mechanism of injury/history of symptoms:  Repetitive strain. Chronic left hip pain. Had a fall in May which triggered mid back pain.     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 No pattern-back.   Symptoms improve with: Rest, lidocaine  patches  Assistive device:  none  Patient's goals for therapy: Reduce pain, Increase ROM / flexibility, Increase strength     Objective    Observation: poor posture  Gait:  Normal    Lumbar Screen:  Decreased / painful Active Range of Motion with extension, rotation B, SB B  Neurologic:  Sensation:  LE  Intact to gross screen  Myotomes:  LE Intact to gross screen     Palpation:  tenderness @ lateral hip, left thoracic paraspinals  Incision:  NA      ROM/Strength      HIP LEFT RIGHT STRENGTH    PROM AROM PROM AROM Left Right   Flexion  82  91 4 4+   Extension         Abduction         IR         ER         Adduction                        KNEE LEFT RIGHT STRENGTH    PROM AROM PROM AROM Left Right   Flexion     4+ 4+   Extension     4 4+                   LE Flexibility  Iliopsoas: Fair  Quadricep:  Fair  Hamstring:  Fair      Special Tests:   Hip FADIR,  negative  FABER,  negative  Gluteal tendinopathy,  negative   Knee NA   Ankle NA          Functional:  Perform self-care activities/basic ADLs - able to perform.  Stand more than 20 minutes, - able to perform.  Walk more than 20 minutes - unable to perform.  Ascend stairs with reciprocal gait - unable to perform.  Descend stairs with reciprocal gait - unable to perform.      Assessment:    Findings consistent with 68 y.o. female with left hip and back pain with ROM limitations, strength limitations, functional limitations.    Please sign this Rx.request for AQUATIC THERAPY if in agreement with plan of care. Thank You  [facility] Sports [facility] Spine Orthopaedic Rehabilitation    Patient would benefit from skilled aquatic therapy to address identified impairments, functional limitations, and pain. Patient currently unable to tolerate land-based intervention and would benefit from therapy in the aquatic environment for greater mobility, decreased joint compression and reduced weight bearing forces. Will transition patient to land-based therapy or independent community program as appropriate.    Personal factors affecting treatment/recovery:  Advanced age  Comorbidities affecting treatment/recovery:  See medical record  Clinical presentation:  evolving  Patient complexity:    moderate level as indicated by above stability of condition, personal factors, environmental factors and comorbidities in addition to patient symptom presentation  and impairments found on physical exam.    Prognosis:  Fair   Contraindications/Precautions/Limitation:  Per diagnosis  Short Term Goals: (3 week(s)): Decrease c/o max pain to < 4/10, Increase strength of core and hips 25%, and Minimal assistance with HEP/ education concepts  Long Term Goals: (2 month(s)): Pain/Sx 0 - minimal, ROM/ flexibility WNL , Restoration of functional strength, Independent with HEP/education , Functional return to ADLs / activities without limitations     Treatment Plan:   Options / plan reviewed with patient/family:  Yes  Freq 1-2 times per week for 2 month(s)    Treatment plan inclusive of:   Exercise: AROM, Stretching, Strengthening, Progressive Resistive, Aerobic exercise   Manual Techniques:  N/A   Modalities:  Aquatic therapy   Functional: Proprioception/Dynamic stability, Functional rehab    Thank you for referring this patient to [facility] Sp[facility].    Jeanpaul Biehl Sonda, PT    Aquatic considerations Hip mobility and strengthening, mid and low back strengthening/postural reeducation                                                Treatment Start Time 1130am   Treatment End Time 1210pm   Total Minutes of treatment 40       Total Non-Treatment time (rest)    Total Service Based min of treatment 30   Total Time-Based min of treatment 10           Service-Based Procedures/ Modalities    Evaluation 30   Re-evaluation    Traction, mechanical    Electric stimulation (unattended)    Vasopneumatic device    Whirlpool    Total Service Based Treatment 30       Time-Based Procedures / Modalities    Therapeutic ex 10   Manual Therapy    Therapeutic Activities    Neuromuscular Re-ed    Physical Performance Test    Gait training, including stairs    Self Care/Home Management Training    Ultrasound    Electric stimulation (manual)    Iontophoresis    Contrast baths    Total Time-Based Treatment 10          POC DATE: 06/20/24

## 2024-06-28 ENCOUNTER — Encounter: Payer: Self-pay | Admitting: Primary Care

## 2024-06-28 DIAGNOSIS — S22000A Wedge compression fracture of unspecified thoracic vertebra, initial encounter for closed fracture: Secondary | ICD-10-CM

## 2024-07-07 ENCOUNTER — Other Ambulatory Visit: Payer: Self-pay

## 2024-07-08 ENCOUNTER — Ambulatory Visit: Payer: Medicare (Managed Care) | Attending: Orthopaedic Surgery

## 2024-07-08 DIAGNOSIS — M5440 Lumbago with sciatica, unspecified side: Secondary | ICD-10-CM | POA: Insufficient documentation

## 2024-07-08 DIAGNOSIS — M1612 Unilateral primary osteoarthritis, left hip: Secondary | ICD-10-CM | POA: Insufficient documentation

## 2024-07-08 DIAGNOSIS — M549 Dorsalgia, unspecified: Secondary | ICD-10-CM | POA: Insufficient documentation

## 2024-07-08 NOTE — Progress Notes (Signed)
 [facility]ine Rehabilitation  PT Treatment NoteName: Danielle Frye Lover: 1957/10/27Referring Physician: Esmeralda Netter Frye*Diagnosis: 1. Arthritis of left hip    2. Acute right-sided low back pain with sciatica, sciatica laterality unspecified    *Aquatic Therapy Patient Agreement reviewed and signed this date.  Subjective:Pain Assessment: 8/10 (when lying in bed)Patient reports a fall back in February and states that her hip remains her primary complaint as of right now. She did see her chiropractor last week who was able to adjust her hip joint which made her feel immediately better. This unfortunately didn't last and is hoping for some more permanent pain relief. She notes very little pain when up and moving around however bed mobility produces significant pain. Objective:Strength - Therapeutic Exercises per flowsheetFunction: - Increased pain with prolonged walking, lying on her left side, Education:  Verbal cues for ther ex, Gait training, Joint ed concepts, Spine ed conceptsObjective  Water Walking (Fwd/Bkwd) 8 min Combo Side Stepping 5 min Mini Squats x20 Hip Program (Flex, Ext, Abd) x15 ea B Planks 10 x 5 ea Side Planks --> Kickboard Push Pulls x20 SLB 3 x 20 B Step Ups --> March Walking 3 min Noodle Pushdowns --> Up and Overs --> Standing in the Flow --> HS Stretch @ Stairs 3 x 30 B   Deep Well  Bicycle 3 min Hang 5 min Treatment:Aquatic therapyAssessment: Patient tolerated her first aquatic session well. Plan of Care:  Continue per Plan of care -  As written; Patient would benefit from skilled rehabilitation services to address the above impairments to restore functional capacity.Thank you for referring this patient to [facility] Sp[facility] Danielle Frye, Danielle Frye    Treatment Start Time 9:00am  Treatment End Time 10:00am Total Minutes of treatment 60 min     Total Non-Treatment time (rest)   Total Service Based min of treatment  Total Time-Based min of treatment 60 min         Service-Based Procedures/ Modalities   Evaluation  Re-evaluation   Traction, mechanical   Electric stimulation (unattended)   Vasopneumatic device   Whirlpool   Total Service Based Treatment      Time-Based Procedures / Modalities   Therapeutic ex 60 min Manual Therapy   Therapeutic Activities   Neuromuscular Re-ed   Physical Performance Test   Gait training, including stairs   Self Care/Home Management Training   Ultrasound   Electric stimulation (manual)   Iontophoresis   Contrast baths   Total Time-Based Treatment 60 min        POC DATE: 06/20/24

## 2024-07-15 ENCOUNTER — Ambulatory Visit: Payer: Medicare (Managed Care)

## 2024-07-15 ENCOUNTER — Other Ambulatory Visit: Payer: Self-pay

## 2024-07-15 DIAGNOSIS — M1612 Unilateral primary osteoarthritis, left hip: Secondary | ICD-10-CM

## 2024-07-15 DIAGNOSIS — M5440 Lumbago with sciatica, unspecified side: Secondary | ICD-10-CM

## 2024-07-15 NOTE — Progress Notes (Signed)
 [facility] Orthopedic Sports/Spine Rehabilitation  PT Treatment NoteName: Danielle Frye: 1957/11/24Referring Physician: Esmeralda Netter Donal*Diagnosis: 1. Arthritis of left hip    2. Acute right-sided low back pain with sciatica, sciatica laterality unspecified    Subjective:Pain Assessment: 7-8/10 (when lying in bed)Patient reports that her left hip was sensitive after her first aquatic session. She also notes some soreness the following day then returned to her baseline. Objective:Strength - Therapeutic Exercises per flowsheetFunction: - Increased pain with prolonged walking, lying on her left side, Education:  Verbal cues for ther ex, Gait training, Joint ed concepts, Spine ed conceptsObjective  Water Walking (Fwd/Bkwd) 8 min Combo Side Stepping 5 min Mini Squats x20 Hip Program (Flex, Ext, Abd) x20 ea B Planks 10 x 5 ea Side Planks 5 x 5 Kickboard Push Pulls x20 SLB 3 x 20 B Step Ups --> March Walking 3 min Noodle Pushdowns x20 B Up and Overs x10 B Standing in the Flow --> HS Stretch @ Stairs 3 x 30 B   Deep Well  Bicycle 3 min Hang 5 min Treatment:Aquatic therapyAssessment: Patient tolerated aquatic session well. Patient noted some left hip pinching during the side planks when performed on the left side. Plan of Care:  Continue per Plan of care -  As written; Patient would benefit from skilled rehabilitation services to address the above impairments to restore functional capacity.Thank you for referring this patient to [facility] Orthopa[facility] - Sports and Spine Danielle Frye, [place]    Treatment Start Time 8:00am Treatment End Time 9:00am Total Minutes of treatment 60 min     Total Non-Treatment time (rest)   Total Service Based min of treatment  Total Time-Based min of treatment 60 min         Service-Based Procedures/  Modalities   Evaluation  Re-evaluation   Traction, mechanical   Electric stimulation (unattended)   Vasopneumatic device   Whirlpool   Total Service Based Treatment      Time-Based Procedures / Modalities   Therapeutic ex 60 min Manual Therapy   Therapeutic Activities   Neuromuscular Re-ed   Physical Performance Test   Gait training, including stairs   Self Care/Home Management Training   Ultrasound   Electric stimulation (manual)   Iontophoresis   Contrast baths   Total Time-Based Treatment 60 min        POC DATE: 06/20/24

## 2024-07-22 ENCOUNTER — Other Ambulatory Visit: Payer: Self-pay | Admitting: Obstetrics and Gynecology

## 2024-07-23 NOTE — Telephone Encounter (Signed)
 Refill request received from patients pharmacy for estradiol  0.0375mg  twice weekly patch. Last seen on 03/01/24 for HRT med check. Med pended if okay.

## 2024-07-26 ENCOUNTER — Other Ambulatory Visit: Payer: Self-pay | Admitting: Obstetrics and Gynecology

## 2024-07-26 ENCOUNTER — Ambulatory Visit: Payer: Medicare (Managed Care)

## 2024-07-26 MED ORDER — ESTRADIOL 0.0375 MG/24HR TD PTTW *A*: 1.0000 | MEDICATED_PATCH | TRANSDERMAL | 1 refills | Status: DC

## 2024-07-26 NOTE — Telephone Encounter (Signed)
 Dr. Milissa, could you please resend estradiol  patch script. Patient's pharmacy says they never got it from 9/2. Thank you

## 2024-07-28 ENCOUNTER — Other Ambulatory Visit: Payer: Self-pay | Admitting: Obstetrics and Gynecology

## 2024-07-29 NOTE — Telephone Encounter (Signed)
 [facility] with 1 refill request received from patients pharmacy for progesterone  100mg  capsule nightly. Last seen on 03/01/24 for HRT follow up and has upcoming appt on 08/23/24. Med pended if okay.

## 2024-08-05 ENCOUNTER — Ambulatory Visit: Payer: Medicare (Managed Care)

## 2024-08-07 ENCOUNTER — Encounter: Payer: Self-pay | Admitting: Primary Care

## 2024-08-09 ENCOUNTER — Other Ambulatory Visit: Payer: Self-pay | Admitting: Obstetrics

## 2024-08-12 ENCOUNTER — Ambulatory Visit: Payer: Medicare (Managed Care)

## 2024-08-14 ENCOUNTER — Telehealth: Payer: Self-pay | Admitting: Primary Care

## 2024-08-14 ENCOUNTER — Other Ambulatory Visit: Payer: Self-pay

## 2024-08-14 NOTE — Telephone Encounter (Signed)
 Copied from CRM [id number]. Topic: Appointments - Reschedule Appointment>> Aug 14, 2024  2:57 PM [name] wrote:Patient, Danielle Frye called to schedule an appointment w/Dr. Gretta regarding her tongue being while (during call patient hasn't read the my-chart message that was left for her).Writer shared the previous scheduled appointment that was scheduled on 08/20/24 with Dr. Gretta.Reason for the cancellation: asking to be seen sooner and the time conflicted.Has the appointment been cancelled? yesHas the appointment been rescheduled? yesDate of new appointment? 08/15/24 Does the patient need a call back to reschedule?yes to confirm this rescheduled date is okay.Patient can be reached at [phone]

## 2024-08-14 NOTE — Telephone Encounter (Signed)
 Confirmed appointment with Dr Gretta for tomorrow.

## 2024-08-15 ENCOUNTER — Ambulatory Visit: Payer: Medicare (Managed Care) | Admitting: Primary Care

## 2024-08-15 ENCOUNTER — Encounter: Payer: Self-pay | Admitting: Primary Care

## 2024-08-15 ENCOUNTER — Other Ambulatory Visit: Payer: Self-pay

## 2024-08-15 VITALS — BP 152/88 | HR 105 | Temp 96.7°F

## 2024-08-15 DIAGNOSIS — R34 Anuria and oliguria: Secondary | ICD-10-CM

## 2024-08-15 DIAGNOSIS — Q383 Other congenital malformations of tongue: Secondary | ICD-10-CM

## 2024-08-15 MED ORDER — CLOTRIMAZOLE 10 MG MT TROC *I*
10.0000 mg | Freq: Every day | OROMUCOSAL | 0 refills | Status: DC
Start: 2024-08-15 — End: 2024-10-08

## 2024-08-15 NOTE — Progress Notes (Signed)
 Internal Medicine Progress NoteFollow up COVID and white coating of the tongueHPI: Notes she had COVID- she had fatigue, fevers, brain fog, chills and persistent cough. She developed this while on a cruise ship. Her tongue began to have a white coating once she started with COVID symptoms.  She does wear retainers- felt her tongue was swollen and dry.Little saliva it seemed- whole mouth felt like sandpaper.  She has hydrated really well and no urinating as much as typical.  Her mind is now clear.Tongue is still coated.  Patient's medications, allergies, past medical, surgical, social and family histories were reviewed, updated Allergies: Allergies as of 08/15/2024 - Up to Date 06/05/2024 Allergen Reaction Noted  Latex Other (See Comments) 10/30/2006  Sulfa antibiotics Other (See Comments) 10/30/2006  Adhesive tape Other (See Comments) 03/01/2024  Home Meds: Prior to Admission medications Medication Sig Start Date End Date Taking? Authorizing Provider progesterone  (PROMETRIUM ) 100 MG capsule TAKE 1 CAPSULE BY MOUTH EACH NIGHT 07/29/24   Milissa Agent, MD estradiol  (VIVELLE -DOT) 0.0375 MG/24HR Apply 1 patch onto the skin twice a week. 07/26/24   Milissa Agent, MD estradiol  (ESTRACE ) 0.1 MG/GM vaginal cream If starting this medication, apply a pea size amount to external vagina nightly for one month, then three times a week. If used before, apply a pea size amount to the external vagina three times a week. 12/08/23   Milissa Agent, MD ascorbic acid 500 mg tablet Take 1 tablet (500 mg total) by mouth daily.    [provider] Multiple Vitamins-Minerals (VITAMIN D3 COMPLETE PO) Take 2,000 Int'l Units/day by mouth daily.    [provider] OMEGA 3-5-6-7-9 FATTY ACIDS PO Take by mouth. Patieint taking omega 7- 1000 mg daily    [provider]  Physical Exam: Last set of vitals:Vitals:  08/15/24 1029 BP: 152/88 Pulse: 105  Temp: 35.9 C (96.7 F) General: NADOropharynx white coating on tongue, indentation in tongue on the side from teeth, no obstruction noted, did not visualize placques on the upper palate or post pharynxAssessment/Plan: Danielle Frye is here today for white coated tongue- given recent COVID could be consistent with oral thrush.  Will treat with clotrimazole  troche.  Check BMP, CBC given decrease in urination.1. Decreased urination  2. Tongue abnormality    Medications/Orders:Orders Placed This Encounter  Basic metabolic panel  CBC  clotrimazole  (MYCELEX ) 10 MG troche RTO: as planned.INOCENTE GORMAN GASKINS, MD on 08/15/2024 at 10:56 AM

## 2024-08-16 ENCOUNTER — Other Ambulatory Visit
Admission: RE | Admit: 2024-08-16 | Discharge: 2024-08-16 | Disposition: A | Discharge status: Home / Self Care | Payer: Medicare (Managed Care) | Source: Ambulatory Visit | Attending: Primary Care | Admitting: Primary Care

## 2024-08-16 ENCOUNTER — Other Ambulatory Visit: Payer: Self-pay

## 2024-08-16 ENCOUNTER — Telehealth: Payer: Self-pay | Admitting: Primary Care

## 2024-08-16 DIAGNOSIS — R34 Anuria and oliguria: Secondary | ICD-10-CM | POA: Insufficient documentation

## 2024-08-16 LAB — BASIC METABOLIC PANEL
Anion Gap: 14 (ref 7–16)
CO2: 22 mmol/L (ref 20–28)
Calcium: 9.4 mg/dL (ref 8.6–10.2)
Chloride: 104 mmol/L (ref 96–108)
Creatinine: 0.83 mg/dL (ref 0.51–0.95)
Glucose: 96 mg/dL (ref 60–99)
Lab: 14 mg/dL (ref 6–20)
Potassium: 4.8 mmol/L (ref 3.3–5.1)
Sodium: 140 mmol/L (ref 133–145)
eGFR BY CREAT: 75

## 2024-08-16 LAB — CBC
Hematocrit: 42 % (ref 34–49)
Hemoglobin: 13.4 g/dL (ref 11.2–16.0)
MCV: 94 fL (ref 75–100)
Platelets: 321 THOU/uL (ref 150–450)
RBC: 4.5 MIL/uL (ref 4.0–5.5)
RDW: 12.4 % (ref 0.0–15.0)
WBC: 4.5 THOU/uL (ref 3.5–11.0)

## 2024-08-16 NOTE — Telephone Encounter (Signed)
 Prior Authorization information for Clotrimazole  Troches submitted to [employer] part D via CoverMyMeds 08/16/2024 , Key: [id number].Awaiting response.

## 2024-08-16 NOTE — Telephone Encounter (Signed)
 Copied from CRM [id number]. Topic: Medications/Prescriptions - Prior Authorization>> Aug 16, 2024  1:09 PM Avelina POUR wrote:[employer] reporting the request for coverage of the patient's clotrimazole  (MYCELEX ) 10 MG troche has been approved. PA # [id number] effective date(s) 08/16/24 through the end of year 12/31/225

## 2024-08-16 NOTE — Telephone Encounter (Signed)
 noted

## 2024-08-19 ENCOUNTER — Ambulatory Visit: Payer: Self-pay | Admitting: Primary Care

## 2024-08-19 ENCOUNTER — Ambulatory Visit: Payer: Medicare (Managed Care)

## 2024-08-20 ENCOUNTER — Ambulatory Visit: Payer: Medicare (Managed Care) | Admitting: Primary Care

## 2024-08-23 ENCOUNTER — Other Ambulatory Visit: Payer: Self-pay | Admitting: Primary Care

## 2024-08-23 ENCOUNTER — Ambulatory Visit: Payer: Medicare (Managed Care) | Attending: Obstetrics and Gynecology | Admitting: Obstetrics and Gynecology

## 2024-08-23 ENCOUNTER — Other Ambulatory Visit: Payer: Self-pay

## 2024-08-23 ENCOUNTER — Encounter: Payer: Self-pay | Admitting: Obstetrics and Gynecology

## 2024-08-23 VITALS — BP 120/70

## 2024-08-23 DIAGNOSIS — N941 Unspecified dyspareunia: Secondary | ICD-10-CM

## 2024-08-23 DIAGNOSIS — Z01419 Encounter for gynecological examination (general) (routine) without abnormal findings: Secondary | ICD-10-CM

## 2024-08-23 DIAGNOSIS — Z79890 Hormone replacement therapy (postmenopausal): Secondary | ICD-10-CM

## 2024-08-23 DIAGNOSIS — Z780 Asymptomatic menopausal state: Secondary | ICD-10-CM

## 2024-08-23 NOTE — Progress Notes (Signed)
 Danielle Frye is a 68 y.o.Danielle Frye female here for her well woman exam.Patient is post menopausalHPI: Danielle Frye states she has been post menopausal since about age 8. She had used estradiol  cream for awhile after menopause but stopped with a divorce. She has remarried and had noted severe dysparunia. She was also feeling more irritable and just not herself. Dr Milissa did start her with estradiol  patch and she does feel more like herself. She has not resumed sexual activity yet and is uncertain impact on the vaginal tissue. She does note an increase in breast fullness and a time in June when she was very tearful. She wonders if trying the lower dose patch might prove usefulJoanne has gone back to work three days a week and is enjoying this. She is married and has 4 grown childrenPap negative with negative HPV 2023, mammogram has scheduled today, DEXA is ordered last score at -2 , Colonoscopy 2023 per recall ,Exercise has been limited by a hip and a back injury. She is beginning to walk a bit moreOB History  No obstetric history on file. Past Medical History[1]Past Surgical History[2]Family History[3]ROS:Per HPICONSTITUTIONAL: Appetite good, no fevers, night sweats or weight lossEYES: No reported visual changesENT: No reported difficultiesCV: No noted concerns RESPIRATORY: No noted cough, wheezing or dyspneaGI: No change in bowel habits, recentlyGU: As notedMS: As notedSKIN: No concerns are noted PSYCH: No noted depression or anxietyENDOCRINE: No heat intoleranceAllergies[4] Current Outpatient Medications Medication  clotrimazole  (MYCELEX ) 10 MG troche  progesterone  (PROMETRIUM ) 100 MG capsule  estradiol  (VIVELLE -DOT) 0.0375 MG/24HR  estradiol  (ESTRACE ) 0.1 MG/GM vaginal cream  Multiple Vitamins-Minerals (VITAMIN D3 COMPLETE PO)  OMEGA 3-5-6-7-9 FATTY ACIDS PO  ascorbic acid 500 mg tablet No current facility-administered medications for this  visit.  Social History[5] Vitals: GENERAL: 68 y.o. female in NAD.HEENT: Neck supple, EOMI, thyroid  without obvious nodules or enlargement. No lymphadenopathy. LUNGS: CTA bilaterally, no wheezes with forced expiration, respirations unlabored.HEART: Regular rate without murmurs.Chaperone CarolAnn Burns in the room BREASTS: No palpable masses or nipple discharge. No skin changes. No axillary or clavicular adenopathy. ABDOMEN: Soft, non-tender, no HSM, no palpable masses,, no noted lesions.  PELVIC: Normal external female genitalia, labia majora, minora, clitoris. BUS WNL.Vagina mild atrophy, notes some discomfort with insertion of speculum, introitus has narrowed; but improves as speculum advanced  Normal appearing cervix. Uterus is midline, small, mobile, and non-tender. There is no CMT. Ovaries are WNL. Adnexa negative for masses.SKIN: No rashes, no evidence of skin breakdown, no suspicious nevi.MENTAL STATUS: Alert, normal MS. Answers all questions appropriately.PHQ-2 is negativeAssessment:Well woman exam Past menopausalHormone replacementBreast enlargement, some tearfulnessPlan:Pap smear held Will trim current 0.0375 mg patch to 3/4 of patch and use twice a week for one month. If this works out well, will change to 0.025 mg patch twice a week thereafter, would like to use patches she has at homeContinue Prometrium  100 mg at home Mammogram yearly Encouraged continued weight bearing exercise for bone and general health Return to office 3 month check  [1] No past medical history on file.[2] Past Surgical History:Procedure Laterality Date  KNEE SURGERY    Knee Surgery Conversion Data   TUBAL LIGATION    Tubal Ligation Conversion Data  [3] Family HistoryProblem Relation Name Age of Onset  Breast cancer Mother    Conversion Other        20110506^Breast Cancer^V16.3^Active^  Conversion Other        20110506^Stroke  Syndrome^436^Active^  Conversion Other        20110506^Hypertension^401.9^Active^  Conversion Other  20110506^Adopted^^Active^ [4] AllergiesAllergen Reactions  Latex Other (See Comments)   Created by Conversion - Rash;  Sulfa Antibiotics Other (See Comments)   Created by Conversion - Hives;cream - rash  Adhesive Tape Other (See Comments) [5] Social HistorySocioeconomic History  Marital status: Married Tobacco Use  Smoking status: Former   Types: Cigarettes   Start date: 05/17/1992  Smokeless tobacco: Never Substance and Sexual Activity  Alcohol use: Yes   Comment: 2 glasses of wine or less a week

## 2024-08-26 ENCOUNTER — Encounter: Payer: Self-pay | Admitting: Obstetrics and Gynecology

## 2024-08-26 NOTE — Telephone Encounter (Signed)
 Pap neg neg hpv 2023

## 2024-09-11 ENCOUNTER — Ambulatory Visit: Payer: Medicare (Managed Care) | Admitting: Physical Medicine and Rehabilitation

## 2024-10-01 ENCOUNTER — Telehealth: Payer: Self-pay | Admitting: Primary Care

## 2024-10-01 ENCOUNTER — Other Ambulatory Visit: Payer: Self-pay | Admitting: Gastroenterology

## 2024-10-01 LAB — HM DEXA SCAN

## 2024-10-01 NOTE — Telephone Encounter (Signed)
 Danielle Frye- can you call them and let them know she can have DEXA w/o TBS?ThanksNSC

## 2024-10-01 NOTE — Telephone Encounter (Signed)
 Phoned Danielle Frye and left VM okaying dexa w/o trabecular bone scoring.Left direct # if any further need or questions

## 2024-10-01 NOTE — Telephone Encounter (Signed)
 Kaitlin from [facility] calling because patient is coming in today 11/11 for a dexa scan and requisition is incorrect. Please call back with verbal order for dexa scan axial with no trabecular bone scoring at [phone] by 1pm today.

## 2024-10-08 ENCOUNTER — Ambulatory Visit: Payer: Medicare (Managed Care) | Admitting: Primary Care

## 2024-10-08 ENCOUNTER — Encounter: Payer: Self-pay | Admitting: Primary Care

## 2024-10-08 ENCOUNTER — Other Ambulatory Visit: Payer: Self-pay

## 2024-10-08 VITALS — BP 150/90 | HR 92 | Temp 96.9°F | Wt 166.0 lb

## 2024-10-08 DIAGNOSIS — Z780 Asymptomatic menopausal state: Secondary | ICD-10-CM

## 2024-10-08 DIAGNOSIS — Z0000 Encounter for general adult medical examination without abnormal findings: Secondary | ICD-10-CM

## 2024-10-08 DIAGNOSIS — M8580 Other specified disorders of bone density and structure, unspecified site: Secondary | ICD-10-CM

## 2024-10-08 DIAGNOSIS — R030 Elevated blood-pressure reading, without diagnosis of hypertension: Secondary | ICD-10-CM

## 2024-10-08 DIAGNOSIS — M25552 Pain in left hip: Secondary | ICD-10-CM

## 2024-10-08 DIAGNOSIS — K146 Glossodynia: Secondary | ICD-10-CM

## 2024-10-08 NOTE — Patient Instructions (Addendum)
 Danielle Frye- [phone] for left hip painPlease consider getting Flu shot and pneumonia shot once you are feeling better.  Lab work.Please check blood pressure at rest- three times per week at rest for 10-15 minutes before taking.  Send via my chart.Thank you for completing your wellness visit with us  today. The purpose of this visits was un:Drmzzw for diseaseAssess risk of future medical problemsHelp develop a healthy lifestyleUpdate vaccinesGet to know your doctor in case of an illnessPatient Care Team:Maybelle Depaoli, Inocente Last, MD as PCP - Fidel 5 Year PlanThe following items were identified as areas of concern during your screening today:BMI greater than 25 - This is a risk for Heart Attack, Stroke, High Blood Pressure, Diabetes, High Cholesterol and other complications. The Health Maintenance table below identifies screening tests and immunizations recommended by your health care team:Health Maintenance: These screening recommendations are based on USPSTF, pulte homes, and [place] state guidelines Topic Date Due  Pneumococcal Vaccination (2 of 2 - PCV) 08/04/2021  Flu Shot (1) 07/22/2024  Osteoporosis Screening  09/28/2024  RSV Vaccine (1 - Risk 50-74 years 1-dose series) 10/08/2028 (Originally 06/13/2004)  Breast Cancer Screening  08/23/2025  Depression - Yearly  10/08/2025  Fall Risk  10/08/2025  Colon Cancer Screening - Other  10/01/2027  DTaP/Tdap/Td Vaccines (2 - Td or Tdap) 03/11/2032  Shingles Vaccine  Completed  HIV Screening  Completed  Hepatitis C Screening  Completed  Hepatitis B Vaccine  Aged Out  HIB Vaccine  Aged Out  HPV Vaccine  Aged Out  Meningococcal Vaccine  Aged Out  Rotavirus Vaccine  Aged Out  Meningitis Vaccine  Aged Out  COVID-19 Vaccine  Discontinued In addition, goals and orders placed to address these recommendations are listed in the Today's Visit section.We wish you the  best of health and look forward to seeing you again next year for your Annual Medicare Wellness Visit. If you have any health care concerns before then, please do not hesitate to contact us .

## 2024-10-08 NOTE — Progress Notes (Signed)
 Visit performed as:        Office Visit, met with patient in personToday we reviewed and updated [place]'s smoking status, activities of daily living, depression screen, fall risk, medications and allergies. I have counseled the patient in the above areas. Subjective: Chief Complaint: Danielle Frye is a 68 y.o. female here for a/an No chief complaint on file.In general, Danielle Frye rates their overall health jd:Qjpm for right now Patient Care Team:Zaeda Mcferran, Inocente Last, MD as PCP - Cherylynn Shuck, DO (Obstetrics and Gynecology) Medications Ordered Prior to Encounter[1]Allergies[2]Patient Active Problem List  Diagnosis Date Noted  Atrophic vaginitis 03/14/2022  S/P colonoscopy 06/16/2016   August 2016, DR. Barratta- sessile serrated adenoma- redo 8/20192019- redo 2023 now due in five years11/2023- Dr. Darin- tubular adenoma  Non-ulcer dyspepsia 06/09/2014  UTI (urinary tract infection) 06/09/2014  Asthmatic Bronchitis 01/15/2008   Created by Conversion  Prehypertension 10/30/2006   Created by Conversion Past Medical History[3]Past Surgical History[4]Family History[5]Social History[6]Objective: Vital Signs: BP 150/90   Pulse 92   Temp 36.1 C (96.9 F)   Wt 75.3 kg (166 lb)   SpO2 98%   BMI 30.36 kg/m  BMI: Body mass index is 30.36 kg/m.Vision Screening Results (Welcome visit only):No results found.Depression Screening Results:Review Flowsheet    10/08/2024 10/05/2023 PHQ Scores PHQ Calculated Score 0 0   Details    Patient-reported   No questionnaires on file. Opioid Use/DAST- 10 Screening Results: How many times in the past year have you used an illegal drug or used a prescription medication for nonmedical reasons?: 0 (10/08/2024  9:51 AM)Activities of Daily Living/Functional Screening Results:Is the person deaf or does he/she have serious  difficulty hearing?: N (10/08/2024  9:50 AM)Is this person blind or does he/she have serious difficulty seeing even when wearing glasses?: N (10/08/2024  9:50 AM)Does this person have serious difficulty walking or climbing stairs?: N (10/08/2024  9:50 AM)Does this person have difficulty dressing or bathing?: N (10/08/2024  9:50 AM)*Shopping: Independent (10/08/2024  9:50 AM)*House Keeping: Independent (10/08/2024  9:50 AM)*Managing Own Medications: Independent (10/08/2024  9:50 AM)*Handling Finances: Independent (10/08/2024  9:50 AM)Fall Risk Screening Results:Have you fallen in the last year?: Yes (10/08/2024  9:49 AM)Did you sustain an injury which required medical attention?: Yes (10/08/2024  9:49 AM)What level of medical attention?: Doctor's Visit (10/08/2024  9:49 AM)Do you feel you are at risk for falling?: No (10/08/2024  9:49 AM)Assessment and Plan: Cognitive Function:Recall of recent and remote events appears:Normal, occasional word finding takes longer.  Advanced Care Planning:was discussed and the paperwork can be found in the scanned media section The following health maintenance plan was reviewed with the patient:Health Maintenance Topics with due status: Overdue   Topic Date Due  IMM Pneumo: 50+ Years 08/04/2021  IMM-Influenza 07/22/2024  Osteoporosis Screening Other 09/28/2024 Health Maintenance Topics with due status: Postponed   Topic Postponed Until  IMM-RSV (Pregnant, Increased Risk 50-74, and 75+) 10/08/2028 (Originally 06/13/2004) Health Maintenance Topics with due status: Not Due   Topic Last Completion Date  IMM DTaP/Tdap/Td 03/11/2022  Colon Cancer Screening Other 09/30/2022  Breast Cancer Screening Other 08/23/2024  Depression Screen Yearly 10/08/2024  Fall Risk Screening 10/08/2024 Health Maintenance Topics with due status: Completed   Topic Last Completion Date  IMM-Zoster  01/14/2022  HIV Scre[place] [facility] 12/05/2023  Hepatitis C Screening [facility]/[place] 12/05/2023 Health Maintenance Topics with due status: Aged [employer] Date Due  IMM-Hepatitis B Vaccine Aged Out  IMM-HIB 0-5 Yrs or At-Risk Patients Aged Out  IMM-HPV 9-26 Yrs or  Shared Decision (28-45 Yrs) Aged Out  IMM-MCV4 0-18 Yrs or At-Risk Patients Aged Out  IMM-Rotavirus 0-8 Months Aged Out  IMM-MenB (2 Plans: Shared decision & Increased Risk Plans) Aged Out Health Maintenance Topics with due status: Discontinued   Topic Date Due  COVID-19 Vaccine Discontinued This health maintenance schedule, identified risks, a list of orders placed today and patient goals have been provided to Danielle Frye in the after visit summary. Plan for any concerns identified during screening or risk assessments:Internal Medicine Progress NoteFollow upHPI: Terricka returns for wellness visit and followup.  She has had ongoing tongue pain since COVID.  She had some areas that she describes as concord grape on the sides of the tongue that have disappeared.  She did take a course of anti-fungal.  Still worse with certain foods.It is improving with time.  Her left hip continues to bother her.  She still has some issues walking and pinching in the hip.  She saw ortho hip but did not feel this appt was particularly helpful and pain is ongoing.She did have a DEXA scan.  Colonoscopy done  2023- due 2028 Dr. Darin.  Mammogram and ultrasound done 2025.Patient's medications, allergies, past medical, surgical, social and family histories were reviewed, updated Allergies: Allergies as of 10/08/2024 - Up to Date 08/23/2024 Allergen Reaction Noted  Latex Other (See Comments) 10/30/2006  Sulfa antibiotics Other (See Comments) 10/30/2006  Adhesive tape Other (See Comments) 03/01/2024  Home Meds: Prior to Admission medications Medication Sig Start Date End Date Taking?  Authorizing Provider clotrimazole  (MYCELEX ) 10 MG troche Take 1 tablet (10 mg total) by mouth 5 times daily for Candidiasis Fungal Infection of the Oropharynx. Slowly dissolve in mouth. Do not swallow whole. 08/15/24   Gretta Inocente Last, MD progesterone  (PROMETRIUM ) 100 MG capsule TAKE 1 CAPSULE BY MOUTH EACH NIGHT 07/29/24   Milissa Agent, MD estradiol  (VIVELLE -DOT) 0.0375 MG/24HR Apply 1 patch onto the skin twice a week. 07/26/24   Milissa Agent, MD estradiol  (ESTRACE ) 0.1 MG/GM vaginal cream If starting this medication, apply a pea size amount to external vagina nightly for one month, then three times a week. If used before, apply a pea size amount to the external vagina three times a week. 12/08/23   Milissa Agent, MD ascorbic acid 500 mg tablet Take 1 tablet (500 mg total) by mouth daily.    [provider] Multiple Vitamins-Minerals (VITAMIN D3 COMPLETE PO) Take 2,000 Int'l Units/day by mouth daily.    [provider] OMEGA 3-5-6-7-9 FATTY ACIDS PO Take by mouth. Patieint taking omega 7- 1000 mg daily    [provider]  Physical Exam: Last set of vitals:Vitals:  10/08/24 0908 BP: 150/90 Pulse: 92 Temp: 36.1 C (96.9 F) Weight: 75.3 kg (166 lb) 140/78 left arm seatedGeneral: NAD, well appearingOropharynx: tongue without coating, no clear ulcerations, tonsils are mildly erythematous, no exudateCardiac: RR no m/r/g.Lungs: CTAAbd: S/ND/NT +BSExt: No LE edema.Neuro: alert and oriented, observed CNs intact, motor symmetricAssessment/Plan: Danielle Frye is here today for wellness visit and fu: 1. Burning tongue : improving, check B12, folate, B6 and zinc, if persists would refer to ENT 2. Left hip pain: ongoing, referral to PMR 3. Preventative health care  4. Osteopenia, unspecified location : discussed DEXA scan, redo in 2 yrs, check vit D level 5. Post-menopausal : on MHT from gyn 6. Elevated systolic blood pressure  reading without diagnosis of hypertension : she is asked to check at home and send via my chart 7. Health care maintenance  Medications/Orders:Orders Placed This Encounter  Vitamin B12  Folate  ZINC  VITAMIN B6  Lipid Panel (Reflex to Direct  LDL if Triglycerides more than 400)  Vitamin D   AMB REFERRAL TO [facility] RTO: one year and as needed.INOCENTE GORMAN GASKINS, MD on 10/08/2024 at 10:13 AM  [1] Current Outpatient Medications on File Prior to Visit Medication Sig Dispense Refill  progesterone  (PROMETRIUM ) 100 MG capsule TAKE 1 CAPSULE BY MOUTH EACH NIGHT 90 capsule 1  estradiol  (VIVELLE -DOT) 0.0375 MG/24HR Apply 1 patch onto the skin twice a week. 24 patch 1  estradiol  (ESTRACE ) 0.1 MG/GM vaginal cream If starting this medication, apply a pea size amount to external vagina nightly for one month, then three times a week. If used before, apply a pea size amount to the external vagina three times a week. 30 g 2  ascorbic acid 500 mg tablet Take 1 tablet (500 mg total) by mouth daily.    Multiple Vitamins-Minerals (VITAMIN D3 COMPLETE PO) Take 2,000 Int'l Units/day by mouth daily.    OMEGA 3-5-6-7-9 FATTY ACIDS PO Take by mouth. Patieint taking omega 7- 1000 mg daily   No current facility-administered medications on file prior to visit. [2] AllergiesAllergen Reactions  Latex Other (See Comments)   Created by Conversion - Rash;  Sulfa Antibiotics Other (See Comments)   Created by Conversion - Hives;cream - rash  Adhesive Tape Other (See Comments) [3] No past medical history on file.[4] Past Surgical History:Procedure Laterality Date  KNEE SURGERY    Knee Surgery Conversion Data   TUBAL LIGATION    Tubal Ligation Conversion Data  [5] Family HistoryProblem Relation Name Age of Onset  Breast cancer Mother    Conversion Other        20110506^Breast Cancer^V16.3^Active^   Conversion Other        20110506^Stroke Syndrome^436^Active^  Conversion Other        20110506^Hypertension^401.9^Active^  Conversion Other        20110506^Adopted^^Active^ [6] Social HistorySocioeconomic History  Marital status: Married Tobacco Use  Smoking status: Former   Types: Cigarettes   Start date: 05/17/1992  Smokeless tobacco: Never Substance and Sexual Activity  Alcohol use: Yes   Comment: 2 glasses of wine or less a week

## 2024-10-28 ENCOUNTER — Other Ambulatory Visit
Admission: RE | Admit: 2024-10-28 | Discharge: 2024-10-28 | Disposition: A | Payer: Medicare (Managed Care) | Source: Ambulatory Visit | Attending: Primary Care | Admitting: Primary Care

## 2024-10-28 DIAGNOSIS — E7800 Pure hypercholesterolemia, unspecified: Secondary | ICD-10-CM | POA: Insufficient documentation

## 2024-10-28 DIAGNOSIS — Z0000 Encounter for general adult medical examination without abnormal findings: Secondary | ICD-10-CM | POA: Insufficient documentation

## 2024-10-28 DIAGNOSIS — M8580 Other specified disorders of bone density and structure, unspecified site: Secondary | ICD-10-CM | POA: Insufficient documentation

## 2024-10-28 DIAGNOSIS — K146 Glossodynia: Secondary | ICD-10-CM | POA: Insufficient documentation

## 2024-10-28 LAB — VITAMIN D: 25-OH Vit Total: 48 ng/mL (ref 30–60)

## 2024-10-28 LAB — LIPID PANEL
Chol/HDL Ratio: 3.5
Cholesterol: 240 mg/dL — AB
HDL: 68 mg/dL — ABNORMAL HIGH (ref 40–60)
LDL Calculated: 150 mg/dL — AB
Non HDL Cholesterol: 172 mg/dL
Triglycerides: 125 mg/dL

## 2024-10-28 LAB — VITAMIN B12: Vitamin B12: 349 pg/mL (ref 232–1245)

## 2024-10-28 LAB — FOLATE: Folate: 8.6 ng/mL (ref >=4.6)

## 2024-10-29 ENCOUNTER — Encounter: Payer: Self-pay | Admitting: Primary Care

## 2024-10-30 LAB — ZINC: Zinc: 85.1 ug/dL (ref 60.0–120.0)

## 2024-11-01 ENCOUNTER — Ambulatory Visit: Payer: Self-pay | Admitting: Primary Care

## 2024-11-01 DIAGNOSIS — E785 Hyperlipidemia, unspecified: Secondary | ICD-10-CM

## 2024-11-01 LAB — VITAMIN B6: Vitamin B6: 41.7 nmol/L (ref 20.0–125.0)

## 2024-11-04 ENCOUNTER — Encounter: Payer: Self-pay | Admitting: Primary Care

## 2024-11-04 NOTE — Telephone Encounter (Signed)
Scheduled for 12-18

## 2024-11-07 ENCOUNTER — Ambulatory Visit: Payer: Medicare (Managed Care) | Admitting: Physical Medicine and Rehabilitation

## 2024-11-07 ENCOUNTER — Other Ambulatory Visit: Payer: Self-pay

## 2024-11-07 VITALS — BP 138/80

## 2024-11-07 DIAGNOSIS — M25552 Pain in left hip: Secondary | ICD-10-CM

## 2024-11-07 DIAGNOSIS — M5416 Radiculopathy, lumbar region: Secondary | ICD-10-CM

## 2024-11-07 DIAGNOSIS — G8929 Other chronic pain: Secondary | ICD-10-CM

## 2024-11-07 NOTE — Progress Notes (Signed)
 History of Present Illness:Back PainEpisode onset: 03/2024 after falling. The problem occurs constantly. Pain location: midline mid thoracic region, left paramidline mid thoracic region. The pain is at a severity of 4/10. Left HipThis is a chronic problem. The problem occurs constantly. Pain location: left posterolateral gluteal region and anterior thighPatient Provided Narrative History:History of Present IllnessCurrent pain medications include: ibuprofen prn;Physical Therapy/Home Exercise Program:- physical therapy 7-06/2024 with significant worsening of symptomsDesired Functional Activity Limited by Pain (behavioral modifications):- ADLs are painfulOther:- works 3 days per week in advisementPast Medical History[1]Objective:Physical ExamVitals reviewed. Constitutional:     Appearance: She is well-developed. HENT:    Head: Normocephalic. Pulmonary:    Effort: Pulmonary effort is normal. Skin:   General: Skin is dry. Neurological:    Mental Status: She is alert. Neurological ExamMental StatusAlert.Motor Strength is 5/5 in all four extremities except as noted.  Tobacco Use: Medium Risk (10/08/2024)  Patient History   Smoking Tobacco Use: Former   Smokeless Tobacco Use: Never   Passive Exposure: Not on file Lab Results Component Value Date  HA1C 5.7 (H) 09/21/2023  CREAT 0.83 08/16/2024  CREAT 0.79 09/21/2023  AST 23 09/21/2023  ALT 12 09/21/2023  ALB 3.9 09/21/2023  Narrative Results Review:ResultsAssessment/Plan Narrative Assessment//Plan:Assessment & PlanLeft gluteal and proximal thigh pain due to intrinsic hip etiology vs possible left L4 radic- Continue current analgesic regimen.- Continue home exercise program- MRI ordered of lumbar spine and hip today- Referred to [facility] to evaluation left hip- Follow up after MRIsT5 compression  fracture and likely T56 disc component - significantly improvedNo problem-specific Assessment & Plan notes found for this encounter.         Follow up  recommended: No follow-ups on file. Patient verbalized understanding of the above instructions. Current Outpatient Medications Medication  progesterone  (PROMETRIUM ) 100 MG capsule  estradiol  (VIVELLE -DOT) 0.0375 MG/24HR  estradiol  (ESTRACE ) 0.1 MG/GM vaginal cream  ascorbic acid 500 mg tablet  Multiple Vitamins-Minerals (VITAMIN D3 COMPLETE PO)  OMEGA 3-5-6-7-9 FATTY ACIDS PO No current facility-administered medications for this visit. Maxine Huynh El-Hassan, MD12/18/2025 Clell Trahan El-Hassan, MDElectronically signed on 11/07/2024 at 3:53 PM. [1] No past medical history on file.

## 2024-11-08 ENCOUNTER — Ambulatory Visit: Payer: Medicare (Managed Care) | Admitting: Physical Medicine and Rehabilitation

## 2024-11-29 ENCOUNTER — Ambulatory Visit: Payer: Medicare (Managed Care) | Attending: Obstetrics and Gynecology | Admitting: Obstetrics and Gynecology

## 2024-11-29 ENCOUNTER — Encounter: Payer: Self-pay | Admitting: Obstetrics and Gynecology

## 2024-11-29 ENCOUNTER — Other Ambulatory Visit: Payer: Self-pay

## 2024-11-29 VITALS — BP 140/80 | Ht 62.0 in | Wt 159.0 lb

## 2024-11-29 DIAGNOSIS — Z79890 Hormone replacement therapy (postmenopausal): Secondary | ICD-10-CM | POA: Insufficient documentation

## 2024-11-29 DIAGNOSIS — N644 Mastodynia: Secondary | ICD-10-CM | POA: Insufficient documentation

## 2024-11-29 DIAGNOSIS — N941 Unspecified dyspareunia: Secondary | ICD-10-CM | POA: Insufficient documentation

## 2024-11-29 DIAGNOSIS — Z124 Encounter for screening for malignant neoplasm of cervix: Secondary | ICD-10-CM | POA: Insufficient documentation

## 2024-11-29 DIAGNOSIS — Z780 Asymptomatic menopausal state: Secondary | ICD-10-CM | POA: Insufficient documentation

## 2024-11-29 MED ORDER — ESTRADIOL 0.025 MG/24HR TD PT TWICE WEEKLY *I*: 1.0000 | MEDICATED_PATCH | TRANSDERMAL | 1 refills | Status: AC

## 2024-11-29 MED ORDER — PROGESTERONE 100 MG PO CAPS *I*: ORAL_CAPSULE | ORAL | 1 refills | Status: AC

## 2024-11-29 NOTE — Progress Notes (Signed)
 Danielle Frye is a 69 y.o. female here for follow up check.  HPI: Danielle Frye notes improved with breast tenderness. She does feel her moods are more labile. She did just find out a good friend has terminal bladder cancer and this has been a stress. She would like to have pap smear, reviewed reasoning behind spacing and may stop at 63, she prefers to have every few years.  Gynecologic History:No LMP recorded. Patient is postmenopausal. Past Medical History[1]Current Medications[2]Past Surgical History[3]Family History[4]ROS:Per HPICONSTITUTIONAL: Appetite good, no fevers, night sweats or weight lossGU: Using estradiol  cream consistently PSYCH: Stressors and more emotional as per HPI Objective:BP 140/80 (BP Location: Right arm, Patient Position: Sitting, Cuff Size: adult)   Ht 1.575 m (5' 2)   Wt 72.1 kg (159 lb)   BMI 29.08 kg/m  GENERAL: 69 y.o. year y/o female in NAD.BREASTS: No palpable masses or nipple discharge. No skin changes. No axillary or clavicular adenopathy.PELVIC: Normal external female genitalia, labia majora, minora, clitoris. BUS WNL. Vagina: pink tissue without lesions. Notes less pain with insertion of speculum Normal appearing cervix. No cervical motion tenderness Uterus is anteverted, mobile, and non-tender. Ovaries are WNL. Adnexa negative for masses.MENTAL STATUS: Alert, normal mood. Normal affect Answers all questions appropriately.  Assessment:Post menopauseHormone replacementDyspareunia Breast tenderness, improvedPlan:Continue with estradiol  vaginal cream as is orderedWill decrease estradiol  patch to 0.025 mg twice a weekPrometrium 100 mg one po dailyDiscussed systemic treatment could be stopped with improved vaginal symptoms, desires to continueSend PapReturn to office: 6 month medication check  [1] No past medical history on file.[2] Current Outpatient Medications:   estradiol  (ESTRACE ) 0.1 MG/GM vaginal  cream, If starting this medication, apply a pea size amount to external vagina nightly for one month, then three times a week. If used before, apply a pea size amount to the external vagina three times a week., Disp: 30 g, Rfl: 2  Multiple Vitamins-Minerals (VITAMIN D3 COMPLETE PO), Take 2,000 Int'l Units/day by mouth daily., Disp: , Rfl:   OMEGA 3-5-6-7-9 FATTY ACIDS PO, Take by mouth. Patieint taking omega 7- 1000 mg daily, Disp: , Rfl:   [START ON 12/02/2024] estradiol  (VIVELLE -DOT) 0.025 MG/24HR patch, Apply 1 patch onto the skin twice a week., Disp: 24 patch, Rfl: 1  progesterone  (PROMETRIUM ) 100 MG capsule, TAKE 1 CAPSULE BY MOUTH EACH NIGHT, Disp: 90 capsule, Rfl: 1  ascorbic acid 500 mg tablet, Take 1 tablet (500 mg total) by mouth daily., Disp: , Rfl: [3] Past Surgical History:Procedure Laterality Date  KNEE SURGERY    Knee Surgery Conversion Data   TUBAL LIGATION    Tubal Ligation Conversion Data  [4] Family HistoryProblem Relation Name Age of Onset  Breast cancer Mother    Conversion Other        20110506^Breast Cancer^V16.3^Active^  Conversion Other        20110506^Stroke Syndrome^436^Active^  Conversion Other        20110506^Hypertension^401.9^Active^  Conversion Other        20110506^Adopted^^Active^

## 2024-12-04 LAB — GYN CYTOLOGY

## 2024-12-05 ENCOUNTER — Telehealth: Payer: Self-pay

## 2024-12-05 ENCOUNTER — Encounter: Payer: Self-pay | Admitting: Obstetrics and Gynecology

## 2024-12-06 ENCOUNTER — Other Ambulatory Visit: Payer: Self-pay

## 2024-12-07 ENCOUNTER — Ambulatory Visit
Admission: RE | Admit: 2024-12-07 | Discharge: 2024-12-07 | Disposition: A | Payer: Medicare (Managed Care) | Source: Ambulatory Visit | Attending: Physical Medicine and Rehabilitation | Admitting: Physical Medicine and Rehabilitation

## 2024-12-07 ENCOUNTER — Other Ambulatory Visit: Payer: Self-pay

## 2024-12-07 DIAGNOSIS — M48061 Spinal stenosis, lumbar region without neurogenic claudication: Secondary | ICD-10-CM

## 2024-12-07 DIAGNOSIS — M75112 Incomplete rotator cuff tear or rupture of left shoulder, not specified as traumatic: Secondary | ICD-10-CM

## 2024-12-07 DIAGNOSIS — S73192A Other sprain of left hip, initial encounter: Secondary | ICD-10-CM

## 2024-12-07 DIAGNOSIS — M5416 Radiculopathy, lumbar region: Secondary | ICD-10-CM

## 2024-12-07 DIAGNOSIS — M25552 Pain in left hip: Secondary | ICD-10-CM

## 2024-12-07 DIAGNOSIS — M4726 Other spondylosis with radiculopathy, lumbar region: Secondary | ICD-10-CM

## 2024-12-07 DIAGNOSIS — M419 Scoliosis, unspecified: Secondary | ICD-10-CM

## 2024-12-07 DIAGNOSIS — M25452 Effusion, left hip: Secondary | ICD-10-CM

## 2024-12-07 DIAGNOSIS — M1612 Unilateral primary osteoarthritis, left hip: Secondary | ICD-10-CM

## 2024-12-23 ENCOUNTER — Ambulatory Visit: Payer: Medicare (Managed Care)

## 2025-01-13 ENCOUNTER — Ambulatory Visit: Payer: Medicare (Managed Care) | Admitting: Physical Medicine and Rehabilitation

## 2025-01-20 ENCOUNTER — Ambulatory Visit: Payer: Medicare (Managed Care)

## 2025-04-01 ENCOUNTER — Ambulatory Visit: Payer: Medicare (Managed Care) | Admitting: Hematology

## 2025-10-13 ENCOUNTER — Ambulatory Visit: Payer: Medicare (Managed Care) | Admitting: Primary Care
